# Patient Record
Sex: Female | Born: 1985 | Race: Black or African American | Hispanic: No | Marital: Single | Smoking: Former smoker
Health system: Southern US, Community
[De-identification: ages and names within clinical notes are randomized; demographics above are authoritative.]

## PROBLEM LIST (undated history)

## (undated) ENCOUNTER — Inpatient Hospital Stay (HOSPITAL_COMMUNITY): Payer: Self-pay

## (undated) DIAGNOSIS — A749 Chlamydial infection, unspecified: Secondary | ICD-10-CM

## (undated) DIAGNOSIS — M545 Low back pain, unspecified: Secondary | ICD-10-CM

## (undated) DIAGNOSIS — F32A Depression, unspecified: Secondary | ICD-10-CM

## (undated) DIAGNOSIS — B009 Herpesviral infection, unspecified: Secondary | ICD-10-CM

## (undated) DIAGNOSIS — F329 Major depressive disorder, single episode, unspecified: Secondary | ICD-10-CM

## (undated) DIAGNOSIS — F4310 Post-traumatic stress disorder, unspecified: Secondary | ICD-10-CM

## (undated) DIAGNOSIS — O24419 Gestational diabetes mellitus in pregnancy, unspecified control: Secondary | ICD-10-CM

## (undated) DIAGNOSIS — Z8632 Personal history of gestational diabetes: Secondary | ICD-10-CM

## (undated) DIAGNOSIS — F339 Major depressive disorder, recurrent, unspecified: Secondary | ICD-10-CM

## (undated) DIAGNOSIS — F419 Anxiety disorder, unspecified: Secondary | ICD-10-CM

## (undated) HISTORY — DX: Herpesviral infection, unspecified: B00.9

## (undated) HISTORY — DX: Depression, unspecified: F32.A

## (undated) HISTORY — DX: Gestational diabetes mellitus in pregnancy, unspecified control: O24.419

## (undated) HISTORY — DX: Morbid (severe) obesity due to excess calories: E66.01

## (undated) HISTORY — DX: Chlamydial infection, unspecified: A74.9

## (undated) HISTORY — DX: Low back pain, unspecified: M54.50

## (undated) HISTORY — DX: Personal history of gestational diabetes: Z86.32

## (undated) HISTORY — DX: Anxiety disorder, unspecified: F41.9

## (undated) HISTORY — PX: NO PAST SURGERIES: SHX2092

## (undated) HISTORY — DX: Post-traumatic stress disorder, unspecified: F43.10

## (undated) HISTORY — DX: Major depressive disorder, recurrent, unspecified: F33.9

---
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
## 2017-02-28 DIAGNOSIS — Z283 Underimmunization status: Secondary | ICD-10-CM | POA: Insufficient documentation

## 2017-02-28 DIAGNOSIS — Z2839 Other underimmunization status: Secondary | ICD-10-CM | POA: Insufficient documentation

## 2017-04-21 ENCOUNTER — Encounter (HOSPITAL_COMMUNITY): Payer: Self-pay | Admitting: *Deleted

## 2017-04-21 ENCOUNTER — Other Ambulatory Visit: Payer: Self-pay

## 2017-04-21 ENCOUNTER — Inpatient Hospital Stay (HOSPITAL_COMMUNITY)
Admission: AD | Admit: 2017-04-21 | Discharge: 2017-04-21 | Disposition: A | Discharge status: Home / Self Care | Payer: Medicare Other | Source: Ambulatory Visit | Attending: Family Medicine | Admitting: Family Medicine

## 2017-04-21 DIAGNOSIS — Z3A16 16 weeks gestation of pregnancy: Secondary | ICD-10-CM | POA: Diagnosis not present

## 2017-04-21 DIAGNOSIS — Z87891 Personal history of nicotine dependence: Secondary | ICD-10-CM | POA: Insufficient documentation

## 2017-04-21 DIAGNOSIS — A084 Viral intestinal infection, unspecified: Secondary | ICD-10-CM

## 2017-04-21 DIAGNOSIS — O219 Vomiting of pregnancy, unspecified: Secondary | ICD-10-CM | POA: Diagnosis present

## 2017-04-21 LAB — URINALYSIS, ROUTINE W REFLEX MICROSCOPIC
Bilirubin Urine: NEGATIVE
Glucose, UA: NEGATIVE mg/dL
Hgb urine dipstick: NEGATIVE
Ketones, ur: 80 mg/dL — AB
Nitrite: NEGATIVE
Protein, ur: 30 mg/dL — AB
Specific Gravity, Urine: 1.024 (ref 1.005–1.030)
pH: 6 (ref 5.0–8.0)

## 2017-04-21 MED ORDER — LACTATED RINGERS IV BOLUS (SEPSIS)
1000.0000 mL | Freq: Once | INTRAVENOUS | Status: AC
  Administered 2017-04-21: 1000 mL via INTRAVENOUS

## 2017-04-21 MED ORDER — FAMOTIDINE IN NACL 20-0.9 MG/50ML-% IV SOLN
20.0000 mg | Freq: Once | INTRAVENOUS | Status: AC
  Administered 2017-04-21: 20 mg via INTRAVENOUS
  Filled 2017-04-21: qty 50

## 2017-04-21 MED ORDER — ONDANSETRON HCL 4 MG/2ML IJ SOLN
4.0000 mg | Freq: Once | INTRAMUSCULAR | Status: AC
  Administered 2017-04-21: 4 mg via INTRAVENOUS
  Filled 2017-04-21: qty 2

## 2017-04-21 MED ORDER — METOCLOPRAMIDE HCL 5 MG/ML IJ SOLN
10.0000 mg | Freq: Once | INTRAMUSCULAR | Status: AC
  Administered 2017-04-21: 10 mg via INTRAVENOUS
  Filled 2017-04-21: qty 2

## 2017-04-21 NOTE — MAU Note (Signed)
Presents to mau with c/o vomiting since 5p on Monday.  C/o dizziness.  Has medication for nausea given by ob at [facility] zofran odt/phenergan and neither are helping.  Has sharp abdominal pain. Unsure is she has loose stools.

## 2017-04-21 NOTE — MAU Provider Note (Signed)
History     CSN: [id number]  Arrival date and time: 04/21/17 1247   First Provider Initiated Contact with Patient 04/21/17 1346      Chief Complaint  Patient presents with  . Emesis During Pregnancy   HPI  Ms. Suzanne Bonilla is a 32 y.o. G3P2002 at 2671w6d who presents to MAU today with complaint of N/V. The patient states that she has had N/V throughout the pregnancy, but it has been worse since last night. She denies diarrhea, fever, bleeding, abdominal pain or sick contacts. She denies complications with this pregnancy, but had GDM with previous pregnancies. She has tried Zofran and Phenergan for the nausea without relief. She is getting prenatal care at [facility] [place] [facility] [place].   OB History    Gravida Para Term Preterm AB Living   3 2 2  0 0 2   SAB TAB Ectopic Multiple Live Births   0 0 0 1 2      History reviewed. No pertinent past medical history.  History reviewed. No pertinent surgical history.  History reviewed. No pertinent family history.  Social History   Tobacco Use  . Smoking status: Former Games developermoker  . Smokeless tobacco: Former Engineer, waterUser  Substance Use Topics  . Alcohol use: No    Frequency: Never  . Drug use: No    Allergies: Not on File  No medications prior to admission.    Review of Systems  Constitutional: Negative for fever.  Gastrointestinal: Positive for nausea and vomiting. Negative for abdominal pain, constipation and diarrhea.  Genitourinary: Negative for dysuria, frequency, urgency, vaginal bleeding and vaginal discharge.   Physical Exam   Blood pressure (!) 117/49, pulse 84, temperature 98.3 F (36.8 C), temperature source Oral, resp. rate 18, height 5\' 10"  (1.778 m), weight 290 lb (131.5 kg), unknown if currently breastfeeding.  Physical Exam  Nursing note and vitals reviewed. Constitutional: She is oriented to person, place, and time. She appears well-developed and well-nourished. No distress.  HENT:  Head: Normocephalic and  atraumatic.  Cardiovascular: Normal rate, regular rhythm and normal heart sounds.  Respiratory: Effort normal and breath sounds normal. No respiratory distress. She has no wheezes.  GI: Soft. Bowel sounds are normal. She exhibits no distension and no mass. There is no tenderness. There is no rebound and no guarding.  Neurological: She is alert and oriented to person, place, and time.  Skin: Skin is warm and dry. No erythema.  Psychiatric: She has a normal mood and affect.   Results for orders placed or performed during the hospital encounter of 04/21/17 (from the past 24 hour(s))  Urinalysis, Routine w reflex microscopic     Status: Abnormal   Collection Time: 04/21/17 10:58 AM  Result Value Ref Range   Color, Urine YELLOW YELLOW   APPearance HAZY (A) CLEAR   Specific Gravity, Urine 1.024 1.005 - 1.030   pH 6.0 5.0 - 8.0   Glucose, UA NEGATIVE NEGATIVE mg/dL   Hgb urine dipstick NEGATIVE NEGATIVE   Bilirubin Urine NEGATIVE NEGATIVE   Ketones, ur 80 (A) NEGATIVE mg/dL   Protein, ur 30 (A) NEGATIVE mg/dL   Nitrite NEGATIVE NEGATIVE   Leukocytes, UA SMALL (A) NEGATIVE   RBC / HPF 0-5 0 - 5 RBC/hpf   WBC, UA 0-5 0 - 5 WBC/hpf   Bacteria, UA RARE (A) NONE SEEN   Squamous Epithelial / LPF 6-30 (A) NONE SEEN   Mucus PRESENT    Hyaline Casts, UA PRESENT     MAU Course  Procedures Pt informed that the ultrasound is considered a limited OB ultrasound and is not intended to be a complete ultrasound exam.  Patient also informed that the ultrasound is not being completed with the intent of assessing for fetal or placental anomalies or any pelvic abnormalities.  Explained that the purpose of today's ultrasound is to assess for  viability.  Patient acknowledges the purpose of the exam and the limitations of the study. +FHR noted. Appears appropriate rate. +FM.   MDM Unable to obtain FHTs with doppler, US performed at bedside as noted above.  UA today  IV LR bolus, 4 mg Zofran and 20 mg  Prilosec Patient reports improvement in symptoms. Tried PO challenge and had some nausea without vomiting. 10 mg IV Reglan given. No further emesis while in MAU. Patient able to tolerate PO.   Assessment and Plan  A: SIUP at [redacted]w[redacted]d Nausea and vomiting in pregnancy prior to 22 weeks likely viral gastroenteritis   P: Discharge home Continue anti-emetics as previously prescribed Second trimester precautions discussed Increase PO hydration and advance diet as tolerated Patient advised to follow-up with [facility] as scheduled for routine prenatal care or sooner PRN Patient may return to MAU as needed or if her condition were to change or worsen   Vonzella Nipple, PA-C 04/21/2017, 4:41 PM

## 2017-04-21 NOTE — MAU Note (Signed)
Pt. States, she feels a little better.  PO challenge initiated. Crackers & ginger ale given.

## 2017-04-21 NOTE — Discharge Instructions (Signed)
Viral Gastroenteritis, Adult  Viral gastroenteritis is also known as the stomach flu. This condition is caused by certain germs (viruses). These germs can be passed from person to person very easily (are very contagious). This condition can cause sudden watery poop (diarrhea), fever, and throwing up (vomiting).  Having watery poop and throwing up can make you feel weak and cause you to get dehydrated. Dehydration can make you tired and thirsty, make you have a dry mouth, and make it so you pee (urinate) less often. Older adults and people with other diseases or a weak defense system (immune system) are at higher risk for dehydration. It is important to replace the fluids that you lose from having watery poop and throwing up.  Follow these instructions at home:  Follow instructions from your doctor about how to care for yourself at home.  Eating and drinking    Follow these instructions as told by your doctor:   Take an oral rehydration solution (ORS). This is a drink that is sold at pharmacies and stores.   Drink clear fluids in small amounts as you are able, such as:  ? Water.  ? Ice chips.  ? Diluted fruit juice.  ? Low-calorie sports drinks.   Eat bland, easy-to-digest foods in small amounts as you are able, such as:  ? Bananas.  ? Applesauce.  ? Rice.  ? Low-fat (lean) meats.  ? Toast.  ? Crackers.   Avoid fluids that have a lot of sugar or caffeine in them.   Avoid alcohol.   Avoid spicy or fatty foods.    General instructions   Drink enough fluid to keep your pee (urine) clear or pale yellow.   Wash your hands often. If you cannot use soap and water, use hand sanitizer.   Make sure that all people in your home wash their hands well and often.   Rest at home while you get better.   Take over-the-counter and prescription medicines only as told by your doctor.   Watch your condition for any changes.   Take a warm bath to help with any burning or pain from having watery poop.   Keep all follow-up  visits as told by your doctor. This is important.  Contact a doctor if:   You cannot keep fluids down.   Your symptoms get worse.   You have new symptoms.   You feel light-headed or dizzy.   You have muscle cramps.  Get help right away if:   You have chest pain.   You feel very weak or you pass out (faint).   You see blood in your throw-up.   Your throw-up looks like coffee grounds.   You have bloody or black poop (stools) or poop that look like tar.   You have a very bad headache, a stiff neck, or both.   You have a rash.   You have very bad pain, cramping, or bloating in your belly (abdomen).   You have trouble breathing.   You are breathing very quickly.   Your heart is beating very quickly.   Your skin feels cold and clammy.   You feel confused.   You have pain when you pee.   You have signs of dehydration, such as:  ? Dark pee, hardly any pee, or no pee.  ? Cracked lips.  ? Dry mouth.  ? Sunken eyes.  ? Sleepiness.  ? Weakness.  This information is not intended to replace advice given to you by your   health care provider. Make sure you discuss any questions you have with your health care provider.  Document Released: 09/24/2007 Document Revised: 10/26/2015 Document Reviewed: 12/12/2014  Elsevier Interactive Patient Education  2017 Elsevier Inc.        Food Choices to Help Relieve Diarrhea, Adult  When you have diarrhea, the foods you eat and your eating habits are very important. Choosing the right foods and drinks can help:   Relieve diarrhea.   Replace lost fluids and nutrients.   Prevent dehydration.    What general guidelines should I follow?  Relieving diarrhea   Choose foods with less than 2 g or .07 oz. of fiber per serving.   Limit fats to less than 8 tsp (38 g or 1.34 oz.) a day.   Avoid the following:  ? Foods and beverages sweetened with high-fructose corn syrup, honey, or sugar alcohols such as xylitol, sorbitol, and mannitol.  ? Foods that contain a lot of fat or  sugar.  ? Fried, greasy, or spicy foods.  ? High-fiber grains, breads, and cereals.  ? Raw fruits and vegetables.   Eat foods that are rich in probiotics. These foods include dairy products such as yogurt and fermented milk products. They help increase healthy bacteria in the stomach and intestines (gastrointestinal tract, or GI tract).   If you have lactose intolerance, avoid dairy products. These may make your diarrhea worse.   Take medicine to help stop diarrhea (antidiarrheal medicine) only as told by your health care provider.  Replacing nutrients   Eat small meals or snacks every 3-4 hours.   Eat bland foods, such as white rice, toast, or baked potato, until your diarrhea starts to get better. Gradually reintroduce nutrient-rich foods as tolerated or as told by your health care provider. This includes:  ? Well-cooked protein foods.  ? Peeled, seeded, and soft-cooked fruits and vegetables.  ? Low-fat dairy products.   Take vitamin and mineral supplements as told by your health care provider.  Preventing dehydration     Start by sipping water or a special solution to prevent dehydration (oral rehydration solution, ORS). Urine that is clear or pale yellow means that you are getting enough fluid.   Try to drink at least 8-10 cups of fluid each day to help replace lost fluids.   You may add other liquids in addition to water, such as clear juice or decaffeinated sports drinks, as tolerated or as told by your health care provider.   Avoid drinks with caffeine, such as coffee, tea, or soft drinks.   Avoid alcohol.  What foods are recommended?  The items listed may not be a complete list. Talk with your health care provider about what dietary choices are best for you.  Grains  White rice. White, French, or pita breads (fresh or toasted), including plain rolls, buns, or bagels. White pasta. Saltine, soda, or graham crackers. Pretzels. Low-fiber cereal. Cooked cereals made with water (such as cornmeal,  farina, or cream cereals). Plain muffins. Matzo. Melba toast. Zwieback.  Vegetables  Potatoes (without the skin). Most well-cooked and canned vegetables without skins or seeds. Tender lettuce.  Fruits  Apple sauce. Fruits canned in juice. Cooked apricots, cherries, grapefruit, peaches, pears, or plums. Fresh bananas and cantaloupe.  Meats and other protein foods  Baked or boiled chicken. Eggs. Tofu. Fish. Seafood. Smooth nut butters. Ground or well-cooked tender beef, ham, veal, lamb, pork, or poultry.  Dairy  Plain yogurt, kefir, and unsweetened liquid yogurt. Lactose-free milk, buttermilk, skim   milk, or soy milk. Low-fat or nonfat hard cheese.  Beverages  Water. Low-calorie sports drinks. Fruit juices without pulp. Strained tomato and vegetable juices. Decaffeinated teas. Sugar-free beverages not sweetened with sugar alcohols. Oral rehydration solutions, if approved by your health care provider.  Seasoning and other foods  Bouillon, broth, or soups made from recommended foods.  What foods are not recommended?  The items listed may not be a complete list. Talk with your health care provider about what dietary choices are best for you.  Grains  Whole grain, whole wheat, bran, or rye breads, rolls, pastas, and crackers. Wild or brown rice. Whole grain or bran cereals. Barley. Oats and oatmeal. Corn tortillas or taco shells. Granola. Popcorn.  Vegetables  Raw vegetables. Fried vegetables. Cabbage, broccoli, Brussels sprouts, artichokes, baked beans, beet greens, corn, kale, legumes, peas, sweet potatoes, and yams. Potato skins. Cooked spinach and cabbage.  Fruits  Dried fruit, including raisins and dates. Raw fruits. Stewed or dried prunes. Canned fruits with syrup.  Meat and other protein foods  Fried or fatty meats. Deli meats. Chunky nut butters. Nuts and seeds. Beans and lentils. Bacon. Hot dogs. Sausage.  Dairy  High-fat cheeses. Whole milk, chocolate milk, and beverages made with milk, such as milk shakes.  Half-and-half. Cream. sour cream. Ice cream.  Beverages  Caffeinated beverages (such as coffee, tea, soda, or energy drinks). Alcoholic beverages. Fruit juices with pulp. Prune juice. Soft drinks sweetened with high-fructose corn syrup or sugar alcohols. High-calorie sports drinks.  Fats and oils  Butter. Cream sauces. Margarine. Salad oils. Plain salad dressings. Olives. Avocados. Mayonnaise.  Sweets and desserts  Sweet rolls, doughnuts, and sweet breads. Sugar-free desserts sweetened with sugar alcohols such as xylitol and sorbitol.  Seasoning and other foods  Honey. Hot sauce. Chili powder. Gravy. Cream-based or milk-based soups. Pancakes and waffles.  Summary   When you have diarrhea, the foods you eat and your eating habits are very important.   Make sure you get at least 8-10 cups of fluid each day, or enough to keep your urine clear or pale yellow.   Eat bland foods and gradually reintroduce healthy, nutrient-rich foods as tolerated, or as told by your health care provider.   Avoid high-fiber, fried, greasy, or spicy foods.  This information is not intended to replace advice given to you by your health care provider. Make sure you discuss any questions you have with your health care provider.  Document Released: 06/28/2003 Document Revised: 04/04/2016 Document Reviewed: 04/04/2016  Elsevier Interactive Patient Education  2018 Elsevier Inc.

## 2017-04-21 NOTE — MAU Note (Signed)
Pt. Feeling nauseous after eating crackers and drinking ginger ale.  Pt. Up to BR for void. Stable gait. Provider notified - new order received.

## 2017-04-21 NOTE — MAU Note (Signed)
[facility] to locate fetal heart rate.  Cardiac activity noted. POCT discontinued.

## 2017-12-23 ENCOUNTER — Encounter (HOSPITAL_COMMUNITY): Payer: Self-pay

## 2018-10-11 LAB — CYTOLOGY - PAP: Pap: NEGATIVE

## 2018-10-28 LAB — OB RESULTS CONSOLE ABO/RH: RH Type: POSITIVE

## 2018-10-28 LAB — URINE CULTURE: Urine Culture, OB: NEGATIVE

## 2018-10-28 LAB — OB RESULTS CONSOLE HGB/HCT, BLOOD
HCT: 34 (ref 29–41)
Hemoglobin: 10.9

## 2018-10-28 LAB — OB RESULTS CONSOLE HIV ANTIBODY (ROUTINE TESTING): HIV: NONREACTIVE

## 2018-10-28 LAB — OB RESULTS CONSOLE RPR: RPR: NONREACTIVE

## 2018-10-28 LAB — OB RESULTS CONSOLE GC/CHLAMYDIA
Chlamydia: NEGATIVE
Gonorrhea: NEGATIVE

## 2018-10-28 LAB — DRUG SCREEN, URINE
Glucose 1 Hour: 224
Hemoglobin A1C: 6.5

## 2018-10-28 LAB — OB RESULTS CONSOLE HEPATITIS B SURFACE ANTIGEN: Hepatitis B Surface Ag: NEGATIVE

## 2018-10-28 LAB — OB RESULTS CONSOLE RUBELLA ANTIBODY, IGM: Rubella: UNDETERMINED

## 2018-10-28 LAB — OB RESULTS CONSOLE ANTIBODY SCREEN: Antibody Screen: NEGATIVE

## 2018-10-28 LAB — OB RESULTS CONSOLE PLATELET COUNT: Platelets: 313

## 2018-10-29 DIAGNOSIS — O24419 Gestational diabetes mellitus in pregnancy, unspecified control: Secondary | ICD-10-CM | POA: Insufficient documentation

## 2018-11-26 LAB — AFP, SERUM, OPEN SPINA BIFIDA: Open Spina bifida: NEGATIVE

## 2019-03-09 ENCOUNTER — Telehealth: Payer: Self-pay | Admitting: *Deleted

## 2019-03-09 NOTE — Telephone Encounter (Signed)
    Lorriane Shire from the [employer] [facility] [employer] [facility] shared with me the need for Diabetes Education for this patient ASAP. She has an MD appointment on Dec 3rd and I have no openings before then. Patient has 3 children and no childcare available.   I spoke to Gillian Scarce, my Manager, to see if we could see her there next Monday since we have time blocked for GDM patients while I am on PAL. She approved for the patient to come to [facility] with her children if necessary.   I called the patient to let her know of that option. She stated she has a history of GDM with 2 of her last 3 pregnancies. She is testing her BG currently and states she is writing her numbers down. She feels her understanding of Carb Counting is adequate. She states she was started on insulin earlier in her pregnancy by another office in [facility], but ran out and has not taken it for 3 months so her blood sugars are too high.   So her immediate need appears to be seeing the MD for her insulin Rx. She declines need for Diabetes Education prior to that appointment. I feel the Diabetes Education appointment will be more effective after she sees the MD and gets her insulin Rx again.

## 2019-03-10 ENCOUNTER — Encounter: Payer: Self-pay | Admitting: *Deleted

## 2019-03-10 ENCOUNTER — Ambulatory Visit (INDEPENDENT_AMBULATORY_CARE_PROVIDER_SITE_OTHER): Payer: Medicare Other | Admitting: *Deleted

## 2019-03-10 ENCOUNTER — Other Ambulatory Visit: Payer: Self-pay

## 2019-03-10 DIAGNOSIS — Z8632 Personal history of gestational diabetes: Secondary | ICD-10-CM

## 2019-03-10 DIAGNOSIS — F4310 Post-traumatic stress disorder, unspecified: Secondary | ICD-10-CM | POA: Insufficient documentation

## 2019-03-10 DIAGNOSIS — Z9189 Other specified personal risk factors, not elsewhere classified: Secondary | ICD-10-CM | POA: Insufficient documentation

## 2019-03-10 DIAGNOSIS — IMO0001 Reserved for inherently not codable concepts without codable children: Secondary | ICD-10-CM | POA: Insufficient documentation

## 2019-03-10 DIAGNOSIS — O0990 Supervision of high risk pregnancy, unspecified, unspecified trimester: Secondary | ICD-10-CM

## 2019-03-10 DIAGNOSIS — F419 Anxiety disorder, unspecified: Secondary | ICD-10-CM

## 2019-03-10 DIAGNOSIS — M545 Low back pain, unspecified: Secondary | ICD-10-CM | POA: Insufficient documentation

## 2019-03-10 DIAGNOSIS — Z91410 Personal history of adult physical and sexual abuse: Secondary | ICD-10-CM | POA: Insufficient documentation

## 2019-03-10 DIAGNOSIS — M5440 Lumbago with sciatica, unspecified side: Secondary | ICD-10-CM

## 2019-03-10 DIAGNOSIS — F339 Major depressive disorder, recurrent, unspecified: Secondary | ICD-10-CM

## 2019-03-10 DIAGNOSIS — Z8619 Personal history of other infectious and parasitic diseases: Secondary | ICD-10-CM

## 2019-03-10 DIAGNOSIS — O24419 Gestational diabetes mellitus in pregnancy, unspecified control: Secondary | ICD-10-CM

## 2019-03-10 MED ORDER — BLOOD PRESSURE KIT DEVI: 1.0000 | 0 refills | Status: AC | PRN

## 2019-03-10 NOTE — Addendum Note (Signed)
Addended by: Samuel Germany on: 03/10/2019 01:25 PM   Modules accepted: Orders

## 2019-03-10 NOTE — Progress Notes (Signed)
I connected with  Suzanne Bonilla on 03/10/19 at  8:30 AM EST by telephone and verified that I am speaking with the correct person using two identifiers.   I discussed the limitations, risks, security and privacy concerns of performing an evaluation and management service by telephone and the availability of in person appointments. I also discussed with the patient that there may be a patient responsible charge related to this service. The patient expressed understanding and agreed to proceed. Explained I am completing her New OB Intake today. She is transferring care from [place]- we have her records. We discussed Her EDD and that it is based on  [place] . I reviewed her allergies, meds, OB History, Medical /Surgical history, and appropriate screenings. I explained I will send her the [employer] app- app sent to her while on phone.  I explained we will send a blood pressure cuff to [facility] that will fill that prescription and they  will call her to verify her information. I asked her to bring the blood pressure cuff with her to her first ob appointment so we can show her how to use it. Explained  then we will have her take her blood pressure weekly and enter into the app. Explained she will have some visits in office and some virtually. She has [facility]Chart with [facility] ; I gave her MyChart help desk to get [place] My Chart linked. I Reviewed appointment date/ time with her , our location and to wear mask, no visitors. Explained she will have exam, ob bloodwork, hemoglobin a1C, cbg , genetic testing if desired, pap if needed. I scheduled an [place] .  She voices understanding.   Linda,RN 03/10/2019  8:56 AM

## 2019-03-10 NOTE — Progress Notes (Signed)
Patient seen and assessed by nursing staff.  Agree with documentation and plan.  

## 2019-03-10 NOTE — Patient Instructions (Signed)

## 2019-03-10 NOTE — Addendum Note (Signed)
Addended by: Geralyn Flash L on: 03/10/2019 02:00 PM   Modules accepted: Orders

## 2019-03-10 NOTE — Progress Notes (Signed)
Addendum:  03/10/19 I made an [place] appointment for first available since per records no anatomy US done and patient has GDM.  Made [place] for first available for 03/25/19 at 0830. I called and notified Kristianna of this appointment. Tamisha Nordstrom,RN

## 2019-03-14 ENCOUNTER — Ambulatory Visit (INDEPENDENT_AMBULATORY_CARE_PROVIDER_SITE_OTHER): Payer: Medicare Other | Admitting: Family Medicine

## 2019-03-14 ENCOUNTER — Other Ambulatory Visit: Payer: Self-pay

## 2019-03-14 ENCOUNTER — Ambulatory Visit: Payer: Self-pay | Admitting: Clinical

## 2019-03-14 ENCOUNTER — Encounter: Payer: Self-pay | Admitting: Family Medicine

## 2019-03-14 VITALS — BP 120/81 | HR 109 | Wt 337.0 lb

## 2019-03-14 DIAGNOSIS — Z2839 Other underimmunization status: Secondary | ICD-10-CM

## 2019-03-14 DIAGNOSIS — O0993 Supervision of high risk pregnancy, unspecified, third trimester: Secondary | ICD-10-CM

## 2019-03-14 DIAGNOSIS — O99213 Obesity complicating pregnancy, third trimester: Secondary | ICD-10-CM

## 2019-03-14 DIAGNOSIS — Z658 Other specified problems related to psychosocial circumstances: Secondary | ICD-10-CM

## 2019-03-14 DIAGNOSIS — O24414 Gestational diabetes mellitus in pregnancy, insulin controlled: Secondary | ICD-10-CM

## 2019-03-14 DIAGNOSIS — O99343 Other mental disorders complicating pregnancy, third trimester: Secondary | ICD-10-CM

## 2019-03-14 DIAGNOSIS — Z3A33 33 weeks gestation of pregnancy: Secondary | ICD-10-CM

## 2019-03-14 DIAGNOSIS — Z8619 Personal history of other infectious and parasitic diseases: Secondary | ICD-10-CM

## 2019-03-14 DIAGNOSIS — Z283 Underimmunization status: Secondary | ICD-10-CM

## 2019-03-14 DIAGNOSIS — F418 Other specified anxiety disorders: Secondary | ICD-10-CM

## 2019-03-14 DIAGNOSIS — Z23 Encounter for immunization: Secondary | ICD-10-CM | POA: Diagnosis not present

## 2019-03-14 DIAGNOSIS — O99891 Other specified diseases and conditions complicating pregnancy: Secondary | ICD-10-CM

## 2019-03-14 DIAGNOSIS — Z114 Encounter for screening for human immunodeficiency virus [HIV]: Secondary | ICD-10-CM

## 2019-03-14 DIAGNOSIS — O0990 Supervision of high risk pregnancy, unspecified, unspecified trimester: Secondary | ICD-10-CM

## 2019-03-14 MED ORDER — ACETAMINOPHEN 500 MG PO TABS: 500.0000 mg | ORAL_TABLET | ORAL | 2 refills | Status: DC | PRN

## 2019-03-14 MED ORDER — BUPROPION HCL ER (XL) 150 MG PO TB24: 150.0000 mg | ORAL_TABLET | Freq: Every day | ORAL | 3 refills | Status: AC

## 2019-03-14 MED ORDER — VALACYCLOVIR HCL 500 MG PO TABS: 500.0000 mg | ORAL_TABLET | Freq: Two times a day (BID) | ORAL | 2 refills | Status: AC

## 2019-03-14 MED ORDER — LEVEMIR FLEXTOUCH 100 UNIT/ML ~~LOC~~ SOPN: 46.0000 [IU] | PEN_INJECTOR | Freq: Every day | SUBCUTANEOUS | 3 refills | Status: DC

## 2019-03-14 MED ORDER — ALBUTEROL SULFATE HFA 108 (90 BASE) MCG/ACT IN AERS: 2.0000 | INHALATION_SPRAY | RESPIRATORY_TRACT | 1 refills | Status: AC | PRN

## 2019-03-14 MED ORDER — HUMALOG JUNIOR KWIKPEN 100 UNIT/ML ~~LOC~~ SOPN: 15.0000 [IU] | PEN_INJECTOR | Freq: Three times a day (TID) | SUBCUTANEOUS | 3 refills | Status: DC

## 2019-03-14 MED ORDER — BD PEN NEEDLE MINI U/F 31G X 5 MM MISC: 3 refills | Status: DC

## 2019-03-14 MED ORDER — SERTRALINE HCL 100 MG PO TABS: 100.0000 mg | ORAL_TABLET | Freq: Every day | ORAL | 3 refills | Status: AC

## 2019-03-14 NOTE — Patient Instructions (Signed)
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
## 2019-03-14 NOTE — Progress Notes (Signed)
GDM on insulin checking Blood sugars out of strips Wellbutrin Flexeril Zoloft Valtrex Prenatals Insulin

## 2019-03-14 NOTE — BH Specialist Note (Signed)
Integrated Behavioral Health Initial Visit  MRN: [id number] Name: Suzanne Bonilla  Number of [place] Clinician visits:: 1/6 Session Start time: 11:55  Session End time: 12:05 Total time: 15  Type of Service: [place] Interpretor:No. Interpretor Name and Language: n/a   Warm Hand Off Completed.       SUBJECTIVE: Suzanne Bonilla is a 33 y.o. female accompanied by children (17yo and 36yo daughters; 1yo son) Patient was referred by Darron Doom, MD for psychosocial. Patient reports the following symptoms/concerns: Pt states her primary concern today is life stress, specifically lack of child care; pt attributes lack of medical care during pregnancy to no childcare available for appointments. Pt says one child's paternal aunt will keep children when she goes into labor; [facility]  Duration of problem: Current pregnancy; Severity of problem: moderate  OBJECTIVE: Mood: Anxious and Affect: Appropriate Risk of harm to self or others: No plan to harm self or others  LIFE CONTEXT: Family and Social: Pt lives with her three children (10yo,4yo;1yo) School/Work: - Self-Care: - Life Changes: Current pregnancy  GOALS ADDRESSED: Patient will: 1. Reduce symptoms of: stress 2. Increase knowledge and/or ability of: stress reduction  3. Demonstrate ability to: Increase adequate support systems for patient/family  INTERVENTIONS: Interventions utilized: Supportive Counseling and Link to [employer]  Standardized Assessments completed: not given today  ASSESSMENT: Patient currently experiencing Psychosocial stress.   Patient may benefit from link to community resource today.  PLAN: 1. Follow up with behavioral health clinician on : One week 2. Behavioral recommendations:  -Sign [id number] consent; accept childcare referral today (aware there is no guarantee)  3. Referral(s): Integrated  [facility] (In Clinic) and [employer]:  childcare 4. "From scale of 1-10, how likely are you to follow plan?": -  [name], LCSW  Depression screen [facility] 2/9 03/10/2019  Decreased Interest 3  Down, Depressed, Hopeless 2  PHQ - 2 Score 5  Altered sleeping 3  Tired, decreased energy 3  Change in appetite 0  Feeling bad or failure about yourself  3  Trouble concentrating 0  Moving slowly or fidgety/restless 0  Suicidal thoughts 0  PHQ-9 Score 14   GAD 7 : Generalized Anxiety Score 03/10/2019  Nervous, Anxious, on Edge 0  Control/stop worrying 3  Worry too much - different things 3  Trouble relaxing 3  Restless 0  Easily annoyed or irritable 3  Afraid - awful might happen 1  Total GAD 7 Score 13

## 2019-03-15 LAB — CBC
Hematocrit: 36.4 % (ref 34.0–46.6)
Hemoglobin: 11 g/dL — ABNORMAL LOW (ref 11.1–15.9)
MCH: 21 pg — ABNORMAL LOW (ref 26.6–33.0)
MCHC: 30.2 g/dL — ABNORMAL LOW (ref 31.5–35.7)
MCV: 69 fL — ABNORMAL LOW (ref 79–97)
Platelets: 333 10*3/uL (ref 150–450)
RBC: 5.25 x10E6/uL (ref 3.77–5.28)
RDW: 16.6 % — ABNORMAL HIGH (ref 11.7–15.4)
WBC: 10 10*3/uL (ref 3.4–10.8)

## 2019-03-15 LAB — HIV ANTIBODY (ROUTINE TESTING W REFLEX): HIV Screen 4th Generation wRfx: NONREACTIVE

## 2019-03-15 LAB — GLUCOSE, CAPILLARY: Glucose-Capillary: 137 mg/dL — ABNORMAL HIGH (ref 70–99)

## 2019-03-15 LAB — RPR: RPR Ser Ql: NONREACTIVE

## 2019-03-16 NOTE — BH Specialist Note (Signed)
Integrated Behavioral Health via Telemedicine Video Visit  03/16/2019 Suzanne Bonilla [id number]  Number of Integrated Behavioral Health visits: 2 Session Start time: 9:18  Session End time: 9:56 Total time: 77  Referring Provider: Tinnie Gens, MD Type of Visit: Video Patient/Family location: Home [facility] Provider location: [facility] All persons participating in visit: Patient Suzanne Bonilla and [facility]      Confirmed patient's address: Yes  Confirmed patient's phone number: Yes  Any changes to demographics: No   Confirmed patient's insurance: Yes  Any changes to patient's insurance: No   Discussed confidentiality: At previous visit  I connected with Suzanne Bonilla  by a video enabled telemedicine application and verified that I am speaking with the correct person using two identifiers.     I discussed the limitations of evaluation and management by telemedicine and the availability of in person appointments.  I discussed that the purpose of this visit is to provide behavioral health care while limiting exposure to the novel coronavirus.   Discussed there is a possibility of technology failure and discussed alternative modes of communication if that failure occurs.  I discussed that engaging in this video visit, they consent to the provision of behavioral healthcare and the services will be billed under their insurance.  Patient and/or legal guardian expressed understanding and consented to video visit: Yes   PRESENTING CONCERNS: Patient and/or family reports the following symptoms/concerns: Pt states her primary concern is stress over lack of reliable childcare. Pt has minimal to no support from children's fathers; goals are to have a healthy pregnancy short-term, and to provide well for her children, by buying her own house and car. Pt attributes symptoms of depression and anxiety to current life stress, and feels it is temporary.  Duration of problem: Current pregnancy;  Severity of problem: moderate  STRENGTHS (Protective Factors/Coping Skills): Some social support from family, strong resilience; learning to set boundaries  GOALS ADDRESSED: Patient will: 1.  Reduce symptoms of: anxiety, depression and stress  2.  Increase knowledge and/or ability of: stress reduction  3.  Demonstrate ability to: Increase healthy adjustment to current life circumstances  INTERVENTIONS: Interventions utilized:  Motivational Interviewing Standardized Assessments completed: Not Needed  ASSESSMENT: Patient currently experiencing Adjustment disorder with anxious and depressed mood and Psychosocial stress  Patient may benefit from continued brief therapeutic intervention regarding coping with symptoms of anxiety, depression and life stress.  PLAN: 1. Follow up with behavioral health clinician on : Two weeks 2. Behavioral recommendations:  -Continue to set healthy boundaries in unhealthy relationships, with a focus on self-care -Continue taking prenatal vitamins daily -Continue with discussions w family regarding need for childcare while in labor and medical visits 3. Referral(s): Integrated [employer] (In Clinic)  I discussed the assessment and treatment plan with the patient and/or parent/guardian. They were provided an opportunity to ask questions and all were answered. They agreed with the plan and demonstrated an understanding of the instructions.   They were advised to call back or seek an in-person evaluation if the symptoms worsen or if the condition fails to improve as anticipated.  Suzanne Bonilla   Depression screen [facility] 2/9 03/10/2019  Decreased Interest 3  Down, Depressed, Hopeless 2  PHQ - 2 Score 5  Altered sleeping 3  Tired, decreased energy 3  Change in appetite 0  Feeling bad or failure about yourself  3  Trouble concentrating 0  Moving slowly or fidgety/restless 0  Suicidal thoughts 0  PHQ-9 Score 14   GAD 7 :  Generalized  Anxiety Score 03/10/2019  Nervous, Anxious, on Edge 0  Control/stop worrying 3  Worry too much - different things 3  Trouble relaxing 3  Restless 0  Easily annoyed or irritable 3  Afraid - awful might happen 1  Total GAD 7 Score 13

## 2019-03-21 ENCOUNTER — Other Ambulatory Visit: Payer: Self-pay

## 2019-03-21 ENCOUNTER — Telehealth: Payer: Self-pay | Admitting: Advanced Practice Midwife

## 2019-03-21 DIAGNOSIS — O0990 Supervision of high risk pregnancy, unspecified, unspecified trimester: Secondary | ICD-10-CM

## 2019-03-21 MED ORDER — ACCU-CHEK GUIDE W/DEVICE KIT: 1.0000 | PACK | Freq: Every day | 0 refills | Status: AC

## 2019-03-21 MED ORDER — PRENATAL 27-1 MG PO TABS: 1.0000 | ORAL_TABLET | Freq: Every day | ORAL | 2 refills | Status: AC

## 2019-03-21 NOTE — Telephone Encounter (Signed)
1 

## 2019-03-22 ENCOUNTER — Other Ambulatory Visit: Payer: Self-pay

## 2019-03-22 ENCOUNTER — Ambulatory Visit (INDEPENDENT_AMBULATORY_CARE_PROVIDER_SITE_OTHER): Payer: Medicare Other | Admitting: Clinical

## 2019-03-22 DIAGNOSIS — F4323 Adjustment disorder with mixed anxiety and depressed mood: Secondary | ICD-10-CM | POA: Diagnosis not present

## 2019-03-22 DIAGNOSIS — Z658 Other specified problems related to psychosocial circumstances: Secondary | ICD-10-CM | POA: Diagnosis not present

## 2019-03-23 ENCOUNTER — Telehealth: Payer: Self-pay | Admitting: Obstetrics and Gynecology

## 2019-03-23 NOTE — Telephone Encounter (Signed)
Called the patient to complete the covid19 screening. Received the message the voicemail box is full and cannot receive messages at this time. °

## 2019-03-24 ENCOUNTER — Ambulatory Visit (INDEPENDENT_AMBULATORY_CARE_PROVIDER_SITE_OTHER): Payer: Medicare Other | Admitting: *Deleted

## 2019-03-24 ENCOUNTER — Other Ambulatory Visit: Payer: Self-pay | Admitting: Family Medicine

## 2019-03-24 ENCOUNTER — Encounter: Payer: Medicare Other | Admitting: Obstetrics & Gynecology

## 2019-03-24 ENCOUNTER — Encounter: Payer: Self-pay | Admitting: Obstetrics and Gynecology

## 2019-03-24 ENCOUNTER — Other Ambulatory Visit: Payer: Self-pay

## 2019-03-24 ENCOUNTER — Ambulatory Visit (INDEPENDENT_AMBULATORY_CARE_PROVIDER_SITE_OTHER): Payer: Medicare Other | Admitting: Obstetrics and Gynecology

## 2019-03-24 VITALS — BP 115/69 | HR 102 | Wt 341.2 lb

## 2019-03-24 DIAGNOSIS — Z2839 Other underimmunization status: Secondary | ICD-10-CM

## 2019-03-24 DIAGNOSIS — O24419 Gestational diabetes mellitus in pregnancy, unspecified control: Secondary | ICD-10-CM

## 2019-03-24 DIAGNOSIS — Z283 Underimmunization status: Secondary | ICD-10-CM

## 2019-03-24 DIAGNOSIS — F339 Major depressive disorder, recurrent, unspecified: Secondary | ICD-10-CM

## 2019-03-24 DIAGNOSIS — O0993 Supervision of high risk pregnancy, unspecified, third trimester: Secondary | ICD-10-CM

## 2019-03-24 DIAGNOSIS — O99343 Other mental disorders complicating pregnancy, third trimester: Secondary | ICD-10-CM

## 2019-03-24 DIAGNOSIS — Z8619 Personal history of other infectious and parasitic diseases: Secondary | ICD-10-CM

## 2019-03-24 DIAGNOSIS — O0990 Supervision of high risk pregnancy, unspecified, unspecified trimester: Secondary | ICD-10-CM

## 2019-03-24 DIAGNOSIS — Z3A34 34 weeks gestation of pregnancy: Secondary | ICD-10-CM

## 2019-03-24 DIAGNOSIS — O24414 Gestational diabetes mellitus in pregnancy, insulin controlled: Secondary | ICD-10-CM

## 2019-03-24 MED ORDER — NOVOLOG FLEXPEN 100 UNIT/ML ~~LOC~~ SOPN: 15.0000 [IU] | PEN_INJECTOR | Freq: Three times a day (TID) | SUBCUTANEOUS | 11 refills | Status: DC

## 2019-03-24 MED ORDER — FIASP FLEXTOUCH 100 UNIT/ML ~~LOC~~ SOPN: 15.0000 [IU] | PEN_INJECTOR | Freq: Three times a day (TID) | SUBCUTANEOUS | 3 refills | Status: DC
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
## 2019-03-24 NOTE — Patient Instructions (Signed)
Third Trimester of Pregnancy The third trimester is from week 28 through week 40 (months 7 through 9). The third trimester is a time when the unborn baby (fetus) is growing rapidly. At the end of the ninth month, the fetus is about 20 inches in length and weighs 6-10 pounds. Body changes during your third trimester Your body will continue to go through many changes during pregnancy. The changes vary from woman to woman. During the third trimester:  Your weight will continue to increase. You can expect to gain 25-35 pounds (11-16 kg) by the end of the pregnancy.  You may begin to get stretch marks on your hips, abdomen, and breasts.  You may urinate more often because the fetus is moving lower into your pelvis and pressing on your bladder.  You may develop or continue to have heartburn. This is caused by increased hormones that slow down muscles in the digestive tract.  You may develop or continue to have constipation because increased hormones slow digestion and cause the muscles that push waste through your intestines to relax.  You may develop hemorrhoids. These are swollen veins (varicose veins) in the rectum that can itch or be painful.  You may develop swollen, bulging veins (varicose veins) in your legs.  You may have increased body aches in the pelvis, back, or thighs. This is due to weight gain and increased hormones that are relaxing your joints.  You may have changes in your hair. These can include thickening of your hair, rapid growth, and changes in texture. Some women also have hair loss during or after pregnancy, or hair that feels dry or thin. Your hair will most likely return to normal after your baby is born.  Your breasts will continue to grow and they will continue to become tender. A yellow fluid (colostrum) may leak from your breasts. This is the first milk you are producing for your baby.  Your belly button may stick out.  You may notice more swelling in your hands,  face, or ankles.  You may have increased tingling or numbness in your hands, arms, and legs. The skin on your belly may also feel numb.  You may feel short of breath because of your expanding uterus.  You may have more problems sleeping. This can be caused by the size of your belly, increased need to urinate, and an increase in your body's metabolism.  You may notice the fetus "dropping," or moving lower in your abdomen (lightening).  You may have increased vaginal discharge.  You may notice your joints feel loose and you may have pain around your pelvic bone. What to expect at prenatal visits You will have prenatal exams every 2 weeks until week 36. Then you will have weekly prenatal exams. During a routine prenatal visit:  You will be weighed to make sure you and the baby are growing normally.  Your blood pressure will be taken.  Your abdomen will be measured to track your baby's growth.  The fetal heartbeat will be listened to.  Any test results from the previous visit will be discussed.  You may have a cervical check near your due date to see if your cervix has softened or thinned (effaced).  You will be tested for Group B streptococcus. This happens between 35 and 37 weeks. Your health care provider may ask you:  What your birth plan is.  How you are feeling.  If you are feeling the baby move.  If you have had any abnormal   symptoms, such as leaking fluid, bleeding, severe headaches, or abdominal cramping.  If you are using any tobacco products, including cigarettes, chewing tobacco, and electronic cigarettes.  If you have any questions. Other tests or screenings that may be performed during your third trimester include:  Blood tests that check for low iron levels (anemia).  Fetal testing to check the health, activity level, and growth of the fetus. Testing is done if you have certain medical conditions or if there are problems during the pregnancy.  Nonstress test  (NST). This test checks the health of your baby to make sure there are no signs of problems, such as the baby not getting enough oxygen. During this test, a belt is placed around your belly. The baby is made to move, and its heart rate is monitored during movement. What is false labor? False labor is a condition in which you feel small, irregular tightenings of the muscles in the womb (contractions) that usually go away with rest, changing position, or drinking water. These are called Braxton Hicks contractions. Contractions may last for hours, days, or even weeks before true labor sets in. If contractions come at regular intervals, become more frequent, increase in intensity, or become painful, you should see your health care provider. What are the signs of labor?  Abdominal cramps.  Regular contractions that start at 10 minutes apart and become stronger and more frequent with time.  Contractions that start on the top of the uterus and spread down to the lower abdomen and back.  Increased pelvic pressure and dull back pain.  A watery or bloody mucus discharge that comes from the vagina.  Leaking of amniotic fluid. This is also known as your "water breaking." It could be a slow trickle or a gush. Let your health care provider know if it has a color or strange odor. If you have any of these signs, call your health care provider right away, even if it is before your due date. Follow these instructions at home: Medicines  Follow your health care provider's instructions regarding medicine use. Specific medicines may be either safe or unsafe to take during pregnancy.  Take a prenatal vitamin that contains at least 600 micrograms (mcg) of folic acid.  If you develop constipation, try taking a stool softener if your health care provider approves. Eating and drinking   Eat a balanced diet that includes fresh fruits and vegetables, whole grains, good sources of protein such as meat, eggs, or tofu,  and low-fat dairy. Your health care provider will help you determine the amount of weight gain that is right for you.  Avoid raw meat and uncooked cheese. These carry germs that can cause birth defects in the baby.  If you have low calcium intake from food, talk to your health care provider about whether you should take a daily calcium supplement.  Eat four or five small meals rather than three large meals a day.  Limit foods that are high in fat and processed sugars, such as fried and sweet foods.  To prevent constipation: ? Drink enough fluid to keep your urine clear or pale yellow. ? Eat foods that are high in fiber, such as fresh fruits and vegetables, whole grains, and beans. Activity  Exercise only as directed by your health care provider. Most women can continue their usual exercise routine during pregnancy. Try to exercise for 30 minutes at least 5 days a week. Stop exercising if you experience uterine contractions.  Avoid heavy lifting.  Do   not exercise in extreme heat or humidity, or at high altitudes.  Wear low-heel, comfortable shoes.  Practice good posture.  You may continue to have sex unless your health care provider tells you otherwise. Relieving pain and discomfort  Take frequent breaks and rest with your legs elevated if you have leg cramps or low back pain.  Take warm sitz baths to soothe any pain or discomfort caused by hemorrhoids. Use hemorrhoid cream if your health care provider approves.  Wear a good support bra to prevent discomfort from breast tenderness.  If you develop varicose veins: ? Wear support pantyhose or compression stockings as told by your healthcare provider. ? Elevate your feet for 15 minutes, 3-4 times a day. Prenatal care  Write down your questions. Take them to your prenatal visits.  Keep all your prenatal visits as told by your health care provider. This is important. Safety  Wear your seat belt at all times when driving.  Make  a list of emergency phone numbers, including numbers for family, friends, the hospital, and police and fire departments. General instructions  Avoid cat litter boxes and soil used by cats. These carry germs that can cause birth defects in the baby. If you have a cat, ask someone to clean the litter box for you.  Do not travel far distances unless it is absolutely necessary and only with the approval of your health care provider.  Do not use hot tubs, steam rooms, or saunas.  Do not drink alcohol.  Do not use any products that contain nicotine or tobacco, such as cigarettes and e-cigarettes. If you need help quitting, ask your health care provider.  Do not use any medicinal herbs or unprescribed drugs. These chemicals affect the formation and growth of the baby.  Do not douche or use tampons or scented sanitary pads.  Do not cross your legs for long periods of time.  To prepare for the arrival of your baby: ? Take prenatal classes to understand, practice, and ask questions about labor and delivery. ? Make a trial run to the hospital. ? Visit the hospital and tour the maternity area. ? Arrange for maternity or paternity leave through employers. ? Arrange for family and friends to take care of pets while you are in the hospital. ? Purchase a rear-facing car seat and make sure you know how to install it in your car. ? Pack your hospital bag. ? Prepare the baby's nursery. Make sure to remove all pillows and stuffed animals from the baby's crib to prevent suffocation.  Visit your dentist if you have not gone during your pregnancy. Use a soft toothbrush to brush your teeth and be gentle when you floss. Contact a health care provider if:  You are unsure if you are in labor or if your water has broken.  You become dizzy.  You have mild pelvic cramps, pelvic pressure, or nagging pain in your abdominal area.  You have lower back pain.  You have persistent nausea, vomiting, or diarrhea.   You have an unusual or bad smelling vaginal discharge.  You have pain when you urinate. Get help right away if:  Your water breaks before 37 weeks.  You have regular contractions less than 5 minutes apart before 37 weeks.  You have a fever.  You are leaking fluid from your vagina.  You have spotting or bleeding from your vagina.  You have severe abdominal pain or cramping.  You have rapid weight loss or weight gain.  You have   shortness of breath with chest pain.  You notice sudden or extreme swelling of your face, hands, ankles, feet, or legs.  Your baby makes fewer than 10 movements in 2 hours.  You have severe headaches that do not go away when you take medicine.  You have vision changes. Summary  The third trimester is from week 28 through week 40, months 7 through 9. The third trimester is a time when the unborn baby (fetus) is growing rapidly.  During the third trimester, your discomfort may increase as you and your baby continue to gain weight. You may have abdominal, leg, and back pain, sleeping problems, and an increased need to urinate.  During the third trimester your breasts will keep growing and they will continue to become tender. A yellow fluid (colostrum) may leak from your breasts. This is the first milk you are producing for your baby.  False labor is a condition in which you feel small, irregular tightenings of the muscles in the womb (contractions) that eventually go away. These are called Braxton Hicks contractions. Contractions may last for hours, days, or even weeks before true labor sets in.  Signs of labor can include: abdominal cramps; regular contractions that start at 10 minutes apart and become stronger and more frequent with time; watery or bloody mucus discharge that comes from the vagina; increased pelvic pressure and dull back pain; and leaking of amniotic fluid. This information is not intended to replace advice given to you by your health  care provider. Make sure you discuss any questions you have with your health care provider. Document Released: 04/01/2001 Document Revised: 07/29/2018 Document Reviewed: 05/13/2016 [employer]vier Patient Education  2020 [employer]  

## 2019-03-24 NOTE — Progress Notes (Signed)
Pt c/o increased pelvic pain x1 month. She also states she has been getting dizzy for the past week - especially when standing.

## 2019-03-25 ENCOUNTER — Ambulatory Visit (INDEPENDENT_AMBULATORY_CARE_PROVIDER_SITE_OTHER): Payer: Medicare Other | Admitting: Clinical

## 2019-03-25 ENCOUNTER — Other Ambulatory Visit (HOSPITAL_COMMUNITY): Payer: Self-pay | Admitting: *Deleted

## 2019-03-25 ENCOUNTER — Encounter (HOSPITAL_COMMUNITY): Payer: Self-pay

## 2019-03-25 ENCOUNTER — Ambulatory Visit (HOSPITAL_COMMUNITY): Payer: Medicare Other | Admitting: *Deleted

## 2019-03-25 ENCOUNTER — Ambulatory Visit (HOSPITAL_COMMUNITY)
Admission: RE | Admit: 2019-03-25 | Discharge: 2019-03-25 | Disposition: A | Discharge status: Home / Self Care | Payer: Medicare Other | Source: Ambulatory Visit | Attending: Obstetrics and Gynecology | Admitting: Obstetrics and Gynecology

## 2019-03-25 DIAGNOSIS — O0933 Supervision of pregnancy with insufficient antenatal care, third trimester: Secondary | ICD-10-CM | POA: Diagnosis not present

## 2019-03-25 DIAGNOSIS — F4310 Post-traumatic stress disorder, unspecified: Secondary | ICD-10-CM | POA: Insufficient documentation

## 2019-03-25 DIAGNOSIS — O24414 Gestational diabetes mellitus in pregnancy, insulin controlled: Secondary | ICD-10-CM

## 2019-03-25 DIAGNOSIS — F419 Anxiety disorder, unspecified: Secondary | ICD-10-CM

## 2019-03-25 DIAGNOSIS — Z8632 Personal history of gestational diabetes: Secondary | ICD-10-CM

## 2019-03-25 DIAGNOSIS — Z91410 Personal history of adult physical and sexual abuse: Secondary | ICD-10-CM

## 2019-03-25 DIAGNOSIS — Z658 Other specified problems related to psychosocial circumstances: Secondary | ICD-10-CM

## 2019-03-25 DIAGNOSIS — Z8619 Personal history of other infectious and parasitic diseases: Secondary | ICD-10-CM

## 2019-03-25 DIAGNOSIS — O09293 Supervision of pregnancy with other poor reproductive or obstetric history, third trimester: Secondary | ICD-10-CM

## 2019-03-25 DIAGNOSIS — O99213 Obesity complicating pregnancy, third trimester: Secondary | ICD-10-CM | POA: Diagnosis not present

## 2019-03-25 DIAGNOSIS — Z3A35 35 weeks gestation of pregnancy: Secondary | ICD-10-CM

## 2019-03-25 DIAGNOSIS — F4323 Adjustment disorder with mixed anxiety and depressed mood: Secondary | ICD-10-CM

## 2019-03-25 DIAGNOSIS — O0990 Supervision of high risk pregnancy, unspecified, unspecified trimester: Secondary | ICD-10-CM

## 2019-03-25 DIAGNOSIS — Z9189 Other specified personal risk factors, not elsewhere classified: Secondary | ICD-10-CM | POA: Insufficient documentation

## 2019-03-25 DIAGNOSIS — O359XX0 Maternal care for (suspected) fetal abnormality and damage, unspecified, not applicable or unspecified: Secondary | ICD-10-CM

## 2019-03-25 DIAGNOSIS — O24419 Gestational diabetes mellitus in pregnancy, unspecified control: Secondary | ICD-10-CM | POA: Diagnosis present

## 2019-03-25 NOTE — BH Specialist Note (Signed)
Integrated Behavioral Health Follow Up Visit  MRN: [id number] Name: Suzanne Bonilla  Number of [place] Clinician visits: 3/6 Session Start time: 10:20  Session End time: 11:00 Total time: 40   Type of Service: [place] Interpretor:No. Interpretor Name and Language: n/a  SUBJECTIVE: Curry Dulski is a 33 y.o. female accompanied by n/a Patient was referred by Darron Doom, MD for positive depression screen. Patient reports the following symptoms/concerns: Pt states her primary concern today is feeling as if "it hasn't sunk in yet" that she is having a baby as early as 2 weeks. Pt says she has almost everything ready for baby except for diapers, but housing issues are causing stress (insects, etc.). She has a friend who has offered to help with children during remainder of medical appointments. And she is taking 200mg  of Zoloft daily, instead of 100mg  as prescribed as that was her previous dosage, along with Wellbutrin, as prescribed.  Duration of problem: Current pregnancy; Severity of problem: moderately severe  OBJECTIVE: Mood: Normal and Affect: Appropriate Risk of harm to self or others: No plan to harm self or others  LIFE CONTEXT: Family and Social: Pt lives with her children (10yo, 89yo, 66yo) School/Work: - Self-Care: - Life Changes: Current pregnancy  GOALS ADDRESSED: Patient will: 1.  Reduce symptoms of: anxiety, depression and stress  2.  Increase knowledge and/or ability of: stress reduction  3.  Demonstrate ability to: Increase healthy adjustment to current life circumstances and Increase motivation to adhere to plan of care  INTERVENTIONS: Interventions utilized:  Medication Monitoring and Psychoeducation and/or Health Education   Standardized Assessments completed: Pt did PHQ9/GAD7 yesterday  ASSESSMENT: Patient currently experiencing Adjustment disorder with mixed anxiety and depressed mood and  Psychosocial stress.   Patient may benefit from continued brief therapeutic interventions regarding coping with symptoms of anxiety and depression, and life stress.  PLAN: 1. Follow up with behavioral health clinician on : As requested, or postpartum 2. Behavioral recommendations:  -Take BH medication as prescribed -Take home newborn diapers today -Consider housing resources provided for possible help with housing issue; continue working with [employer] about problems 3. Referral(s): [place] (In Clinic) 4. "From scale of 1-10, how likely are you to follow plan?": -  [name], LCSW  Depression screen [facility] 2/9 03/24/2019 03/10/2019  Decreased Interest 3 3  Down, Depressed, Hopeless 1 2  PHQ - 2 Score 4 5  Altered sleeping 3 3  Tired, decreased energy 3 3  Change in appetite 3 0  Feeling bad or failure about yourself  3 3  Trouble concentrating 0 0  Moving slowly or fidgety/restless 2 0  Suicidal thoughts 0 0  PHQ-9 Score 18 14   GAD 7 : Generalized Anxiety Score 03/24/2019 03/10/2019  Nervous, Anxious, on Edge 0 0  Control/stop worrying 2 3  Worry too much - different things 3 3  Trouble relaxing 2 3  Restless 1 0  Easily annoyed or irritable 3 3  Afraid - awful might happen 0 1  Total GAD 7 Score 11 13

## 2019-03-25 NOTE — Patient Instructions (Signed)
Housing Resources                    [employer] (serves [place], [place], [place], [place], [place], [place], [place], [place], [place], [place], [place], [place], and [place] counties) [address], [place], [place] [address] [phone] [email or web address]  **Rental assistance, Home Rehabilitation,Weatherization Assistance Program, Forensic psychologist, Housing Voucher Program   [employer] [place] [address], [place], [place] [address] [phone] [email or web address] [address] [place], [place] [address] [phone] [email or web address] **Programs include: Foreclosure Prevention and Housing Counseling, Healthy Homes/Tenant Advocacy, Homeless Prevention and [place], [place], [place] [address] [phone] [employer]no **Environmental Exposure Assessment (investigation of homes where either children or pregnant women with a confirmed elevated blood lead level reside)  [employer] [address], [place], [place] [address] [phone] [email or web address] **Offers credit-building and banking services to people unable to use traditional banking

## 2019-03-26 ENCOUNTER — Other Ambulatory Visit: Payer: Self-pay | Admitting: Family Medicine

## 2019-03-31 ENCOUNTER — Ambulatory Visit: Payer: Medicare Other | Admitting: *Deleted

## 2019-03-31 ENCOUNTER — Ambulatory Visit (INDEPENDENT_AMBULATORY_CARE_PROVIDER_SITE_OTHER): Payer: Medicare Other | Admitting: Obstetrics and Gynecology

## 2019-03-31 ENCOUNTER — Encounter: Payer: Self-pay | Admitting: Obstetrics and Gynecology

## 2019-03-31 ENCOUNTER — Encounter: Payer: Medicare Other | Attending: Family Medicine | Admitting: *Deleted

## 2019-03-31 ENCOUNTER — Other Ambulatory Visit (HOSPITAL_COMMUNITY)
Admission: RE | Admit: 2019-03-31 | Discharge: 2019-03-31 | Disposition: A | Discharge status: Home / Self Care | Payer: Medicare Other | Source: Ambulatory Visit | Attending: Obstetrics and Gynecology | Admitting: Obstetrics and Gynecology

## 2019-03-31 ENCOUNTER — Other Ambulatory Visit: Payer: Self-pay

## 2019-03-31 VITALS — BP 131/86 | HR 84 | Wt 344.0 lb

## 2019-03-31 DIAGNOSIS — Z713 Dietary counseling and surveillance: Secondary | ICD-10-CM | POA: Insufficient documentation

## 2019-03-31 DIAGNOSIS — O09899 Supervision of other high risk pregnancies, unspecified trimester: Secondary | ICD-10-CM

## 2019-03-31 DIAGNOSIS — O0990 Supervision of high risk pregnancy, unspecified, unspecified trimester: Secondary | ICD-10-CM | POA: Insufficient documentation

## 2019-03-31 DIAGNOSIS — Z8619 Personal history of other infectious and parasitic diseases: Secondary | ICD-10-CM

## 2019-03-31 DIAGNOSIS — O99891 Other specified diseases and conditions complicating pregnancy: Secondary | ICD-10-CM

## 2019-03-31 DIAGNOSIS — O0993 Supervision of high risk pregnancy, unspecified, third trimester: Secondary | ICD-10-CM

## 2019-03-31 DIAGNOSIS — O24419 Gestational diabetes mellitus in pregnancy, unspecified control: Secondary | ICD-10-CM | POA: Insufficient documentation

## 2019-03-31 DIAGNOSIS — O24414 Gestational diabetes mellitus in pregnancy, insulin controlled: Secondary | ICD-10-CM

## 2019-03-31 DIAGNOSIS — Z2839 Other underimmunization status: Secondary | ICD-10-CM

## 2019-03-31 DIAGNOSIS — Z3A35 35 weeks gestation of pregnancy: Secondary | ICD-10-CM

## 2019-03-31 DIAGNOSIS — Z283 Underimmunization status: Secondary | ICD-10-CM

## 2019-03-31 LAB — POCT URINALYSIS DIP (DEVICE)
Bilirubin Urine: NEGATIVE
Glucose, UA: NEGATIVE mg/dL
Hgb urine dipstick: NEGATIVE
Ketones, ur: NEGATIVE mg/dL
Leukocytes,Ua: NEGATIVE
Nitrite: NEGATIVE
Protein, ur: NEGATIVE mg/dL
Specific Gravity, Urine: 1.025 (ref 1.005–1.030)
Urobilinogen, UA: 0.2 mg/dL (ref 0.0–1.0)
pH: 6.5 (ref 5.0–8.0)
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
## 2019-03-31 NOTE — Progress Notes (Signed)
  Patient was seen on 03/31/2019 for Gestational Diabetes self-management. EDD 04/29/2019 but patient states she will be induced at 37 weeks. Patient states history of GDM with 2 of the last 3 pregnancies. Diet history obtained. Patient eats fair variety of all food groups. Beverages include water and milk with occasional fruit juices and Gingerale.  The following learning objectives were met by the patient :   States the definition of Gestational Diabetes  States why dietary management is important in controlling blood glucose  Describes the effects of carbohydrates on blood glucose levels  Demonstrates ability to create a balanced meal plan  Demonstrates carbohydrate counting   States when to check blood glucose levels  Demonstrates proper blood glucose monitoring techniques  States the effect of stress and exercise on blood glucose levels  States the importance of limiting caffeine and abstaining from alcohol and smoking  Plan:  Due to her height, aim for 3 - 4 Carb Choices per meal (45 - 60 grams)   Aim for 1-2 Carbs per snack Begin reading food labels for Total Carbohydrate of foods If OK with your MD, consider  increasing your activity level by walking, Arm Chair Exercises or other activity daily as tolerated Begin checking BG before breakfast and 2 hours after first bite of breakfast, lunch and dinner as directed by MD  Bring Log Book/Sheet and meter to every medical appointment Take medication if directed by MD  Patient is testing pre breakfast and 2 hours each meal as directed by MD MD reviewed her blood sugar information at earlier appointment today and confirmed she should continue with insulin doses @ 50 units Levemir at night and 15 units Novolog before each meal  Patient instructed to monitor glucose levels: FBS: 60 - 95 mg/dl 2 hour: <120 mg/dl  Patient received the following handouts:  Nutrition Diabetes and Pregnancy  Carbohydrate Counting List  Patient will  be seen for follow-up as needed.

## 2019-04-01 ENCOUNTER — Ambulatory Visit (HOSPITAL_COMMUNITY)
Admission: RE | Admit: 2019-04-01 | Discharge: 2019-04-01 | Disposition: A | Discharge status: Home / Self Care | Payer: Medicare Other | Source: Ambulatory Visit | Attending: Obstetrics and Gynecology | Admitting: Obstetrics and Gynecology

## 2019-04-01 ENCOUNTER — Ambulatory Visit (HOSPITAL_COMMUNITY): Payer: Medicare Other | Admitting: *Deleted

## 2019-04-01 ENCOUNTER — Encounter (HOSPITAL_COMMUNITY): Payer: Self-pay

## 2019-04-01 ENCOUNTER — Other Ambulatory Visit: Payer: Self-pay

## 2019-04-01 DIAGNOSIS — Z8619 Personal history of other infectious and parasitic diseases: Secondary | ICD-10-CM | POA: Insufficient documentation

## 2019-04-01 DIAGNOSIS — F419 Anxiety disorder, unspecified: Secondary | ICD-10-CM

## 2019-04-01 DIAGNOSIS — F4310 Post-traumatic stress disorder, unspecified: Secondary | ICD-10-CM

## 2019-04-01 DIAGNOSIS — Z8632 Personal history of gestational diabetes: Secondary | ICD-10-CM | POA: Insufficient documentation

## 2019-04-01 DIAGNOSIS — Z9189 Other specified personal risk factors, not elsewhere classified: Secondary | ICD-10-CM | POA: Diagnosis present

## 2019-04-01 DIAGNOSIS — O24414 Gestational diabetes mellitus in pregnancy, insulin controlled: Secondary | ICD-10-CM | POA: Diagnosis present

## 2019-04-01 DIAGNOSIS — Z91410 Personal history of adult physical and sexual abuse: Secondary | ICD-10-CM | POA: Insufficient documentation

## 2019-04-01 DIAGNOSIS — O99213 Obesity complicating pregnancy, third trimester: Secondary | ICD-10-CM

## 2019-04-01 DIAGNOSIS — O09293 Supervision of pregnancy with other poor reproductive or obstetric history, third trimester: Secondary | ICD-10-CM

## 2019-04-01 DIAGNOSIS — O359XX0 Maternal care for (suspected) fetal abnormality and damage, unspecified, not applicable or unspecified: Secondary | ICD-10-CM

## 2019-04-01 DIAGNOSIS — O0990 Supervision of high risk pregnancy, unspecified, unspecified trimester: Secondary | ICD-10-CM | POA: Diagnosis present

## 2019-04-01 DIAGNOSIS — O0933 Supervision of pregnancy with insufficient antenatal care, third trimester: Secondary | ICD-10-CM

## 2019-04-01 DIAGNOSIS — Z3A36 36 weeks gestation of pregnancy: Secondary | ICD-10-CM

## 2019-04-01 LAB — CERVICOVAGINAL ANCILLARY ONLY
Bacterial Vaginitis (gardnerella): NEGATIVE
Candida Glabrata: NEGATIVE
Candida Vaginitis: POSITIVE — AB
Chlamydia: NEGATIVE
Comment: NEGATIVE
Comment: NEGATIVE
Comment: NEGATIVE
Comment: NEGATIVE
Comment: NORMAL
Neisseria Gonorrhea: NEGATIVE

## 2019-04-01 NOTE — BH Specialist Note (Signed)
Pt did not arrive to video visit and did not answer the phone ; Voicemail is full, so unable to leave phone message; left MyChart message for patient.    [place] via Telemedicine Video Visit  04/01/2019 Suzanne Bonilla [id number]   [name]

## 2019-04-03 MED ORDER — TERCONAZOLE 0.8 % VA CREA: 1.0000 | TOPICAL_CREAM | Freq: Every day | VAGINAL | 0 refills | Status: DC

## 2019-04-03 NOTE — Addendum Note (Signed)
Addended by: Vivien Rota on: 04/03/2019 09:46 PM   Modules accepted: Orders

## 2019-04-04 LAB — CULTURE, BETA STREP (GROUP B ONLY): Strep Gp B Culture: NEGATIVE

## 2019-04-05 ENCOUNTER — Encounter (HOSPITAL_COMMUNITY): Payer: Self-pay

## 2019-04-05 ENCOUNTER — Other Ambulatory Visit (HOSPITAL_COMMUNITY): Payer: Medicare Other

## 2019-04-05 ENCOUNTER — Encounter: Payer: Self-pay | Admitting: Family Medicine

## 2019-04-05 ENCOUNTER — Ambulatory Visit: Payer: Medicare Other | Admitting: Clinical

## 2019-04-05 ENCOUNTER — Telehealth (HOSPITAL_COMMUNITY): Payer: Self-pay | Admitting: *Deleted

## 2019-04-05 DIAGNOSIS — Z5329 Procedure and treatment not carried out because of patient's decision for other reasons: Secondary | ICD-10-CM

## 2019-04-05 DIAGNOSIS — Z91199 Patient's noncompliance with other medical treatment and regimen due to unspecified reason: Secondary | ICD-10-CM

## 2019-04-05 NOTE — Patient Instructions (Signed)
Suzanne Bonilla  04/05/2019   Your procedure is scheduled on:  04/08/2019  Arrive at 27 at Entrance C on [address] at [facility]  and [employer]. You are invited to use the FREE valet parking or use the Visitor's parking deck.  Pick up the phone at the desk and dial [phone].  Call this number if you have problems the morning of surgery: [phone]  Remember:   Do not eat food:(After Midnight) Desps de medianoche.  Do not drink clear liquids: (After Midnight) Desps de medianoche.  Take these medicines the morning of surgery with A SIP OF WATER:  Take 25 units of insulin at bedtime the night before your surgery.  Take valtrex the morning of surgery as usual.  If you take your zoloft and or wellbutrin each morning, continue taking those.  No other medications the morning of surgery.    Do not wear jewelry, make-up or nail polish.  Do not wear lotions, powders, or perfumes. Do not wear deodorant.  Do not shave 48 hours prior to surgery.  Do not bring valuables to the hospital.  [facility] is not   responsible for any belongings or valuables brought to the hospital.  Contacts, dentures or bridgework may not be worn into surgery.  Leave suitcase in the car. After surgery it may be brought to your room.  For patients admitted to the hospital, checkout time is 11:00 AM the day of              discharge.      Please read over the following fact sheets that you were given:     Preparing for Surgery

## 2019-04-05 NOTE — Telephone Encounter (Signed)
Preadmission screen  

## 2019-04-06 ENCOUNTER — Other Ambulatory Visit (HOSPITAL_COMMUNITY)
Admission: RE | Admit: 2019-04-06 | Discharge: 2019-04-06 | Disposition: A | Discharge status: Home / Self Care | Payer: Medicare Other | Source: Ambulatory Visit | Attending: Family Medicine | Admitting: Family Medicine

## 2019-04-06 ENCOUNTER — Other Ambulatory Visit: Payer: Self-pay

## 2019-04-06 DIAGNOSIS — Z20828 Contact with and (suspected) exposure to other viral communicable diseases: Secondary | ICD-10-CM | POA: Insufficient documentation

## 2019-04-06 DIAGNOSIS — Z01812 Encounter for preprocedural laboratory examination: Secondary | ICD-10-CM | POA: Insufficient documentation

## 2019-04-06 LAB — COMPREHENSIVE METABOLIC PANEL
ALT: 16 U/L (ref 0–44)
AST: 21 U/L (ref 15–41)
Albumin: 2.5 g/dL — ABNORMAL LOW (ref 3.5–5.0)
Alkaline Phosphatase: 102 U/L (ref 38–126)
Anion gap: 10 (ref 5–15)
BUN: 11 mg/dL (ref 6–20)
CO2: 21 mmol/L — ABNORMAL LOW (ref 22–32)
Calcium: 9.1 mg/dL (ref 8.9–10.3)
Chloride: 104 mmol/L (ref 98–111)
Creatinine, Ser: 0.65 mg/dL (ref 0.44–1.00)
GFR calc Af Amer: >60 mL/min (ref >60)
GFR calc non Af Amer: >60 mL/min (ref >60)
Glucose, Bld: 126 mg/dL — ABNORMAL HIGH (ref 70–99)
Potassium: 3.9 mmol/L (ref 3.5–5.1)
Sodium: 135 mmol/L (ref 135–145)
Total Bilirubin: 0.2 mg/dL — ABNORMAL LOW (ref 0.3–1.2)
Total Protein: 6.4 g/dL — ABNORMAL LOW (ref 6.5–8.1)

## 2019-04-06 LAB — CBC
HCT: 35 % — ABNORMAL LOW (ref 36.0–46.0)
Hemoglobin: 10.8 g/dL — ABNORMAL LOW (ref 12.0–15.0)
MCH: 21.3 pg — ABNORMAL LOW (ref 26.0–34.0)
MCHC: 30.9 g/dL (ref 30.0–36.0)
MCV: 69.2 fL — ABNORMAL LOW (ref 80.0–100.0)
Platelets: 275 10*3/uL (ref 150–400)
RBC: 5.06 MIL/uL (ref 3.87–5.11)
RDW: 16.5 % — ABNORMAL HIGH (ref 11.5–15.5)
WBC: 8.8 10*3/uL (ref 4.0–10.5)
nRBC: 0 % (ref 0.0–0.2)

## 2019-04-06 LAB — SARS CORONAVIRUS 2 (TAT 6-24 HRS): SARS Coronavirus 2: NEGATIVE

## 2019-04-06 LAB — TYPE AND SCREEN
ABO/RH(D): B POS
Antibody Screen: NEGATIVE

## 2019-04-06 LAB — RPR: RPR Ser Ql: NONREACTIVE

## 2019-04-06 LAB — ABO/RH: ABO/RH(D): B POS

## 2019-04-06 NOTE — MAU Note (Signed)
Asymptomatic, swab collected. Lab called 

## 2019-04-07 ENCOUNTER — Telehealth (INDEPENDENT_AMBULATORY_CARE_PROVIDER_SITE_OTHER): Payer: Medicare Other | Admitting: Obstetrics and Gynecology

## 2019-04-07 ENCOUNTER — Encounter: Payer: Self-pay | Admitting: Obstetrics and Gynecology

## 2019-04-07 DIAGNOSIS — O09899 Supervision of other high risk pregnancies, unspecified trimester: Secondary | ICD-10-CM

## 2019-04-07 DIAGNOSIS — O24414 Gestational diabetes mellitus in pregnancy, insulin controlled: Secondary | ICD-10-CM | POA: Diagnosis not present

## 2019-04-07 DIAGNOSIS — Z3A36 36 weeks gestation of pregnancy: Secondary | ICD-10-CM

## 2019-04-07 DIAGNOSIS — O99891 Other specified diseases and conditions complicating pregnancy: Secondary | ICD-10-CM | POA: Diagnosis not present

## 2019-04-07 DIAGNOSIS — Z283 Underimmunization status: Secondary | ICD-10-CM

## 2019-04-07 DIAGNOSIS — O0993 Supervision of high risk pregnancy, unspecified, third trimester: Secondary | ICD-10-CM

## 2019-04-07 DIAGNOSIS — Z2839 Other underimmunization status: Secondary | ICD-10-CM

## 2019-04-07 DIAGNOSIS — Z3490 Encounter for supervision of normal pregnancy, unspecified, unspecified trimester: Secondary | ICD-10-CM

## 2019-04-07 DIAGNOSIS — O0990 Supervision of high risk pregnancy, unspecified, unspecified trimester: Secondary | ICD-10-CM
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
## 2019-04-07 NOTE — Progress Notes (Signed)
  Pt has Paper log for Glucose,

## 2019-04-08 ENCOUNTER — Encounter (HOSPITAL_COMMUNITY)
Admission: RE | Disposition: A | Discharge status: Home / Self Care | Payer: Self-pay | Source: Home / Self Care | Attending: Obstetrics and Gynecology

## 2019-04-08 ENCOUNTER — Inpatient Hospital Stay (HOSPITAL_COMMUNITY)
Admission: RE | Admit: 2019-04-08 | Discharge: 2019-04-11 | DRG: 788 | Disposition: A | Discharge status: Home / Self Care | Payer: Medicare Other | Attending: Obstetrics and Gynecology | Admitting: Obstetrics and Gynecology

## 2019-04-08 ENCOUNTER — Inpatient Hospital Stay (HOSPITAL_COMMUNITY): Payer: Medicare Other

## 2019-04-08 ENCOUNTER — Encounter (HOSPITAL_COMMUNITY): Payer: Self-pay | Admitting: Obstetrics and Gynecology

## 2019-04-08 ENCOUNTER — Other Ambulatory Visit: Payer: Self-pay

## 2019-04-08 ENCOUNTER — Ambulatory Visit (HOSPITAL_COMMUNITY): Payer: Medicare Other

## 2019-04-08 DIAGNOSIS — O99214 Obesity complicating childbirth: Secondary | ICD-10-CM | POA: Diagnosis present

## 2019-04-08 DIAGNOSIS — Z23 Encounter for immunization: Secondary | ICD-10-CM | POA: Diagnosis present

## 2019-04-08 DIAGNOSIS — O24424 Gestational diabetes mellitus in childbirth, insulin controlled: Secondary | ICD-10-CM | POA: Diagnosis present

## 2019-04-08 DIAGNOSIS — O9902 Anemia complicating childbirth: Secondary | ICD-10-CM | POA: Diagnosis present

## 2019-04-08 DIAGNOSIS — B009 Herpesviral infection, unspecified: Secondary | ICD-10-CM | POA: Diagnosis present

## 2019-04-08 DIAGNOSIS — Z20828 Contact with and (suspected) exposure to other viral communicable diseases: Secondary | ICD-10-CM | POA: Diagnosis present

## 2019-04-08 DIAGNOSIS — A6000 Herpesviral infection of urogenital system, unspecified: Secondary | ICD-10-CM | POA: Diagnosis present

## 2019-04-08 DIAGNOSIS — O98519 Other viral diseases complicating pregnancy, unspecified trimester: Secondary | ICD-10-CM | POA: Diagnosis present

## 2019-04-08 DIAGNOSIS — O34211 Maternal care for low transverse scar from previous cesarean delivery: Secondary | ICD-10-CM

## 2019-04-08 DIAGNOSIS — D649 Anemia, unspecified: Secondary | ICD-10-CM | POA: Diagnosis present

## 2019-04-08 DIAGNOSIS — O9832 Other infections with a predominantly sexual mode of transmission complicating childbirth: Principal | ICD-10-CM | POA: Diagnosis present

## 2019-04-08 DIAGNOSIS — Z3A37 37 weeks gestation of pregnancy: Secondary | ICD-10-CM

## 2019-04-08 DIAGNOSIS — O9852 Other viral diseases complicating childbirth: Secondary | ICD-10-CM

## 2019-04-08 DIAGNOSIS — Z87891 Personal history of nicotine dependence: Secondary | ICD-10-CM | POA: Diagnosis not present

## 2019-04-08 LAB — CBC
HCT: 32.5 % — ABNORMAL LOW (ref 36.0–46.0)
Hemoglobin: 10.4 g/dL — ABNORMAL LOW (ref 12.0–15.0)
MCH: 22.2 pg — ABNORMAL LOW (ref 26.0–34.0)
MCHC: 32 g/dL (ref 30.0–36.0)
MCV: 69.4 fL — ABNORMAL LOW (ref 80.0–100.0)
Platelets: 250 10*3/uL (ref 150–400)
RBC: 4.68 MIL/uL (ref 3.87–5.11)
RDW: 16.5 % — ABNORMAL HIGH (ref 11.5–15.5)
WBC: 12.3 10*3/uL — ABNORMAL HIGH (ref 4.0–10.5)
nRBC: 0 % (ref 0.0–0.2)

## 2019-04-08 LAB — CREATININE, SERUM
Creatinine, Ser: 0.79 mg/dL (ref 0.44–1.00)
GFR calc Af Amer: >60 mL/min (ref >60)
GFR calc non Af Amer: >60 mL/min (ref >60)

## 2019-04-08 LAB — GLUCOSE, CAPILLARY
Glucose-Capillary: 107 mg/dL — ABNORMAL HIGH (ref 70–99)
Glucose-Capillary: 106 mg/dL — ABNORMAL HIGH (ref 70–99)

## 2019-04-08 SURGERY — Surgical Case
Anesthesia: Spinal | Site: Abdomen | Wound class: Clean Contaminated

## 2019-04-08 MED ORDER — GLYCOPYRROLATE PF 0.2 MG/ML IJ SOSY
PREFILLED_SYRINGE | INTRAMUSCULAR | Status: AC
  Filled 2019-04-08: qty 1

## 2019-04-08 MED ORDER — SIMETHICONE 80 MG PO CHEW
80.0000 mg | CHEWABLE_TABLET | ORAL | Status: DC
  Administered 2019-04-09 – 2019-04-11 (×3): 80 mg via ORAL
  Filled 2019-04-08 (×4): qty 1

## 2019-04-08 MED ORDER — STERILE WATER FOR IRRIGATION IR SOLN
Status: DC | PRN
  Administered 2019-04-08: 1000 mL

## 2019-04-08 MED ORDER — DIPHENHYDRAMINE HCL 25 MG PO CAPS
25.0000 mg | ORAL_CAPSULE | Freq: Four times a day (QID) | ORAL | Status: DC | PRN
  Administered 2019-04-08: 25 mg via ORAL

## 2019-04-08 MED ORDER — WITCH HAZEL-GLYCERIN EX PADS: 1.0000 "application " | MEDICATED_PAD | CUTANEOUS | Status: DC | PRN

## 2019-04-08 MED ORDER — MORPHINE SULFATE (PF) 0.5 MG/ML IJ SOLN
INTRAMUSCULAR | Status: AC
  Filled 2019-04-08: qty 10

## 2019-04-08 MED ORDER — DIPHENHYDRAMINE HCL 50 MG/ML IJ SOLN
INTRAMUSCULAR | Status: DC | PRN
  Administered 2019-04-08: 25 mg via INTRAVENOUS

## 2019-04-08 MED ORDER — ENOXAPARIN SODIUM 80 MG/0.8ML ~~LOC~~ SOLN
0.5000 mg/kg | SUBCUTANEOUS | Status: DC
  Administered 2019-04-09 – 2019-04-11 (×3): 80 mg via SUBCUTANEOUS
  Filled 2019-04-08 (×3): qty 0.8

## 2019-04-08 MED ORDER — GLYCOPYRROLATE 0.2 MG/ML IJ SOLN
INTRAMUSCULAR | Status: DC | PRN
  Administered 2019-04-08: 31.2 ug via INTRAVENOUS

## 2019-04-08 MED ORDER — FERROUS SULFATE 325 (65 FE) MG PO TABS
325.0000 mg | ORAL_TABLET | Freq: Two times a day (BID) | ORAL | Status: DC
  Administered 2019-04-09 – 2019-04-11 (×5): 325 mg via ORAL
  Filled 2019-04-08 (×6): qty 1

## 2019-04-08 MED ORDER — ETONOGESTREL 68 MG ~~LOC~~ IMPL
68.0000 mg | DRUG_IMPLANT | Freq: Once | SUBCUTANEOUS | Status: DC
  Filled 2019-04-08: qty 1

## 2019-04-08 MED ORDER — SOD CITRATE-CITRIC ACID 500-334 MG/5ML PO SOLN
30.0000 mL | ORAL | Status: AC
  Administered 2019-04-08: 30 mL via ORAL

## 2019-04-08 MED ORDER — SODIUM CHLORIDE 0.9% FLUSH: 3.0000 mL | INTRAVENOUS | Status: DC | PRN

## 2019-04-08 MED ORDER — SCOPOLAMINE 1 MG/3DAYS TD PT72
MEDICATED_PATCH | TRANSDERMAL | Status: AC
  Filled 2019-04-08: qty 1

## 2019-04-08 MED ORDER — ONDANSETRON HCL 4 MG/2ML IJ SOLN
INTRAMUSCULAR | Status: DC | PRN
  Administered 2019-04-08: 4 mg via INTRAVENOUS

## 2019-04-08 MED ORDER — PHENYLEPHRINE HCL (PRESSORS) 10 MG/ML IV SOLN
INTRAVENOUS | Status: DC | PRN
  Administered 2019-04-08 (×5): 80 ug via INTRAVENOUS

## 2019-04-08 MED ORDER — PRENATAL MULTIVITAMIN CH
1.0000 | ORAL_TABLET | Freq: Every day | ORAL | Status: DC
  Administered 2019-04-09 – 2019-04-11 (×3): 1 via ORAL
  Filled 2019-04-08 (×3): qty 1

## 2019-04-08 MED ORDER — OXYCODONE HCL 5 MG PO TABS: 5.0000 mg | ORAL_TABLET | Freq: Once | ORAL | Status: DC | PRN

## 2019-04-08 MED ORDER — EPHEDRINE 5 MG/ML INJ
INTRAVENOUS | Status: AC
  Filled 2019-04-08: qty 10

## 2019-04-08 MED ORDER — METOCLOPRAMIDE HCL 5 MG/ML IJ SOLN
INTRAMUSCULAR | Status: DC | PRN
  Administered 2019-04-08: 10 mg via INTRAVENOUS

## 2019-04-08 MED ORDER — EPHEDRINE SULFATE 50 MG/ML IJ SOLN
INTRAMUSCULAR | Status: DC | PRN
  Administered 2019-04-08 (×3): 5 mg via INTRAVENOUS

## 2019-04-08 MED ORDER — LIDOCAINE HCL 1 % IJ SOLN
0.0000 mL | Freq: Once | INTRAMUSCULAR | Status: DC | PRN
  Filled 2019-04-08: qty 20

## 2019-04-08 MED ORDER — SOD CITRATE-CITRIC ACID 500-334 MG/5ML PO SOLN
ORAL | Status: AC
  Filled 2019-04-08: qty 30

## 2019-04-08 MED ORDER — LACTATED RINGERS IV SOLN: INTRAVENOUS | Status: DC

## 2019-04-08 MED ORDER — OXYCODONE HCL 5 MG/5ML PO SOLN: 5.0000 mg | Freq: Once | ORAL | Status: DC | PRN

## 2019-04-08 MED ORDER — SENNOSIDES-DOCUSATE SODIUM 8.6-50 MG PO TABS
2.0000 | ORAL_TABLET | ORAL | Status: DC
  Administered 2019-04-09 – 2019-04-11 (×3): 2 via ORAL
  Filled 2019-04-08 (×3): qty 2

## 2019-04-08 MED ORDER — DIBUCAINE (PERIANAL) 1 % EX OINT: 1.0000 "application " | TOPICAL_OINTMENT | CUTANEOUS | Status: DC | PRN

## 2019-04-08 MED ORDER — SIMETHICONE 80 MG PO CHEW: 80.0000 mg | CHEWABLE_TABLET | ORAL | Status: DC | PRN

## 2019-04-08 MED ORDER — DEXTROSE 5 % IV SOLN: 3.0000 g | INTRAVENOUS | Status: DC

## 2019-04-08 MED ORDER — HYDROMORPHONE HCL 1 MG/ML IJ SOLN: 0.2000 mg | INTRAMUSCULAR | Status: DC | PRN

## 2019-04-08 MED ORDER — FENTANYL CITRATE (PF) 100 MCG/2ML IJ SOLN: 25.0000 ug | INTRAMUSCULAR | Status: DC | PRN

## 2019-04-08 MED ORDER — DIPHENHYDRAMINE HCL 50 MG/ML IJ SOLN
INTRAMUSCULAR | Status: AC
  Filled 2019-04-08: qty 1

## 2019-04-08 MED ORDER — ACETAMINOPHEN 500 MG PO TABS
1000.0000 mg | ORAL_TABLET | Freq: Four times a day (QID) | ORAL | Status: DC
  Administered 2019-04-08 – 2019-04-11 (×12): 1000 mg via ORAL
  Filled 2019-04-08 (×13): qty 2

## 2019-04-08 MED ORDER — KETOROLAC TROMETHAMINE 30 MG/ML IJ SOLN
30.0000 mg | Freq: Four times a day (QID) | INTRAMUSCULAR | Status: AC
  Administered 2019-04-09: 30 mg via INTRAVENOUS
  Filled 2019-04-08 (×2): qty 1

## 2019-04-08 MED ORDER — TETANUS-DIPHTH-ACELL PERTUSSIS 5-2.5-18.5 LF-MCG/0.5 IM SUSP: 0.5000 mL | Freq: Once | INTRAMUSCULAR | Status: DC

## 2019-04-08 MED ORDER — MENTHOL 3 MG MT LOZG: 1.0000 | LOZENGE | OROMUCOSAL | Status: DC | PRN

## 2019-04-08 MED ORDER — KETOROLAC TROMETHAMINE 30 MG/ML IJ SOLN
30.0000 mg | Freq: Once | INTRAMUSCULAR | Status: AC
  Administered 2019-04-08: 30 mg via INTRAVENOUS

## 2019-04-08 MED ORDER — NALOXONE HCL 4 MG/10ML IJ SOLN
1.0000 ug/kg/h | INTRAVENOUS | Status: DC | PRN
  Filled 2019-04-08: qty 5

## 2019-04-08 MED ORDER — ONDANSETRON HCL 4 MG/2ML IJ SOLN
INTRAMUSCULAR | Status: AC
  Filled 2019-04-08: qty 2

## 2019-04-08 MED ORDER — PHENYLEPHRINE HCL-NACL 20-0.9 MG/250ML-% IV SOLN
INTRAVENOUS | Status: DC | PRN
  Administered 2019-04-08: 60 ug/min via INTRAVENOUS

## 2019-04-08 MED ORDER — ZOLPIDEM TARTRATE 5 MG PO TABS: 5.0000 mg | ORAL_TABLET | Freq: Every evening | ORAL | Status: DC | PRN

## 2019-04-08 MED ORDER — NALBUPHINE HCL 10 MG/ML IJ SOLN
5.0000 mg | Freq: Once | INTRAMUSCULAR | Status: AC | PRN
  Administered 2019-04-09: 5 mg via INTRAVENOUS

## 2019-04-08 MED ORDER — OXYTOCIN 40 UNITS IN NORMAL SALINE INFUSION - SIMPLE MED
INTRAVENOUS | Status: DC | PRN
  Administered 2019-04-08: 40 mL via INTRAVENOUS

## 2019-04-08 MED ORDER — OXYCODONE HCL 5 MG PO TABS
5.0000 mg | ORAL_TABLET | ORAL | Status: DC | PRN
  Administered 2019-04-09 (×3): 5 mg via ORAL
  Administered 2019-04-10 – 2019-04-11 (×7): 10 mg via ORAL
  Filled 2019-04-08: qty 1
  Filled 2019-04-08 (×2): qty 2
  Filled 2019-04-08: qty 1
  Filled 2019-04-08 (×2): qty 2
  Filled 2019-04-08: qty 1
  Filled 2019-04-08 (×3): qty 2

## 2019-04-08 MED ORDER — DEXTROSE 5 % IV SOLN
INTRAVENOUS | Status: DC | PRN
  Administered 2019-04-08: 3 g via INTRAVENOUS

## 2019-04-08 MED ORDER — DIPHENHYDRAMINE HCL 50 MG/ML IJ SOLN: 12.5000 mg | INTRAMUSCULAR | Status: DC | PRN

## 2019-04-08 MED ORDER — BUPIVACAINE HCL (PF) 0.5 % IJ SOLN
INTRAMUSCULAR | Status: AC
  Filled 2019-04-08: qty 30

## 2019-04-08 MED ORDER — FENTANYL CITRATE (PF) 100 MCG/2ML IJ SOLN
INTRAMUSCULAR | Status: DC | PRN
  Administered 2019-04-08: 50 ug via INTRATHECAL
  Administered 2019-04-08: 15 ug via INTRATHECAL
  Administered 2019-04-08: 35 ug via INTRATHECAL

## 2019-04-08 MED ORDER — DIPHENHYDRAMINE HCL 25 MG PO CAPS
25.0000 mg | ORAL_CAPSULE | ORAL | Status: DC | PRN
  Filled 2019-04-08: qty 1

## 2019-04-08 MED ORDER — KETOROLAC TROMETHAMINE 30 MG/ML IJ SOLN
INTRAMUSCULAR | Status: AC
  Filled 2019-04-08: qty 1

## 2019-04-08 MED ORDER — FENTANYL CITRATE (PF) 100 MCG/2ML IJ SOLN
INTRAMUSCULAR | Status: AC
  Filled 2019-04-08: qty 2

## 2019-04-08 MED ORDER — SIMETHICONE 80 MG PO CHEW
80.0000 mg | CHEWABLE_TABLET | Freq: Three times a day (TID) | ORAL | Status: DC
  Administered 2019-04-08 – 2019-04-11 (×8): 80 mg via ORAL
  Filled 2019-04-08 (×8): qty 1

## 2019-04-08 MED ORDER — INFLUENZA VAC SPLIT QUAD 0.5 ML IM SUSY
0.5000 mL | PREFILLED_SYRINGE | INTRAMUSCULAR | Status: AC
  Administered 2019-04-11: 0.5 mL via INTRAMUSCULAR
  Filled 2019-04-08: qty 0.5

## 2019-04-08 MED ORDER — NALBUPHINE HCL 10 MG/ML IJ SOLN: 5.0000 mg | Freq: Once | INTRAMUSCULAR | Status: AC | PRN

## 2019-04-08 MED ORDER — TRANEXAMIC ACID-NACL 1000-0.7 MG/100ML-% IV SOLN
INTRAVENOUS | Status: DC | PRN
  Administered 2019-04-08: 1000 mg via INTRAVENOUS

## 2019-04-08 MED ORDER — MEPERIDINE HCL 25 MG/ML IJ SOLN: 6.2500 mg | INTRAMUSCULAR | Status: DC | PRN

## 2019-04-08 MED ORDER — IBUPROFEN 800 MG PO TABS
800.0000 mg | ORAL_TABLET | Freq: Four times a day (QID) | ORAL | Status: DC
  Administered 2019-04-09: 800 mg via ORAL
  Filled 2019-04-08 (×2): qty 1

## 2019-04-08 MED ORDER — ONDANSETRON HCL 4 MG/2ML IJ SOLN: 4.0000 mg | Freq: Three times a day (TID) | INTRAMUSCULAR | Status: DC | PRN

## 2019-04-08 MED ORDER — NALOXONE HCL 0.4 MG/ML IJ SOLN: 0.4000 mg | INTRAMUSCULAR | Status: DC | PRN

## 2019-04-08 MED ORDER — NALBUPHINE HCL 10 MG/ML IJ SOLN: 5.0000 mg | INTRAMUSCULAR | Status: DC | PRN

## 2019-04-08 MED ORDER — COCONUT OIL OIL: 1.0000 "application " | TOPICAL_OIL | Status: DC | PRN

## 2019-04-08 MED ORDER — BUPIVACAINE IN DEXTROSE 0.75-8.25 % IT SOLN
INTRATHECAL | Status: DC | PRN
  Administered 2019-04-08: 2 mL via INTRATHECAL

## 2019-04-08 MED ORDER — SCOPOLAMINE 1 MG/3DAYS TD PT72
1.0000 | MEDICATED_PATCH | Freq: Once | TRANSDERMAL | Status: AC
  Administered 2019-04-08: 15:00:00 1.5 mg via TRANSDERMAL

## 2019-04-08 MED ORDER — ONDANSETRON HCL 4 MG/2ML IJ SOLN: 4.0000 mg | Freq: Once | INTRAMUSCULAR | Status: DC | PRN

## 2019-04-08 MED ORDER — PHENYLEPHRINE 40 MCG/ML (10ML) SYRINGE FOR IV PUSH (FOR BLOOD PRESSURE SUPPORT)
PREFILLED_SYRINGE | INTRAVENOUS | Status: AC
  Filled 2019-04-08: qty 10

## 2019-04-08 MED ORDER — DEXTROSE 5 % IV SOLN
INTRAVENOUS | Status: AC
  Filled 2019-04-08: qty 3000

## 2019-04-08 MED ORDER — SODIUM CHLORIDE 0.9 % IR SOLN
Status: DC | PRN
  Administered 2019-04-08: 1000 mL

## 2019-04-08 MED ORDER — OXYTOCIN 40 UNITS IN NORMAL SALINE INFUSION - SIMPLE MED: 2.5000 [IU]/h | INTRAVENOUS | Status: AC

## 2019-04-08 MED ORDER — MORPHINE SULFATE (PF) 0.5 MG/ML IJ SOLN
INTRAMUSCULAR | Status: DC | PRN
  Administered 2019-04-08: .15 mg via INTRATHECAL

## 2019-04-08 MED ORDER — SODIUM CHLORIDE 0.9 % IV SOLN: INTRAVENOUS | Status: DC | PRN

## 2019-04-08 MED ORDER — METOCLOPRAMIDE HCL 5 MG/ML IJ SOLN
INTRAMUSCULAR | Status: AC
  Filled 2019-04-08: qty 2

## 2019-04-08 MED ORDER — NALBUPHINE HCL 10 MG/ML IJ SOLN
5.0000 mg | INTRAMUSCULAR | Status: DC | PRN
  Filled 2019-04-08: qty 1

## 2019-04-08 SURGICAL SUPPLY — 37 items
BENZOIN TINCTURE PRP APPL 2/3 (GAUZE/BANDAGES/DRESSINGS) IMPLANT
CHLORAPREP W/TINT 26ML (MISCELLANEOUS) ×2 IMPLANT
CLAMP CORD UMBIL (MISCELLANEOUS) IMPLANT
CLOTH BEACON ORANGE TIMEOUT ST (SAFETY) ×2 IMPLANT
DRSG OPSITE POSTOP 4X10 (GAUZE/BANDAGES/DRESSINGS) ×2 IMPLANT
ELECT REM PT RETURN 9FT ADLT (ELECTROSURGICAL) ×2
ELECTRODE REM PT RTRN 9FT ADLT (ELECTROSURGICAL) ×1 IMPLANT
GLOVE BIOGEL PI IND STRL 6.5 (GLOVE) ×1 IMPLANT
GLOVE BIOGEL PI IND STRL 7.0 (GLOVE) ×2 IMPLANT
GLOVE BIOGEL PI INDICATOR 6.5 (GLOVE) ×1
GLOVE BIOGEL PI INDICATOR 7.0 (GLOVE) ×2
GLOVE ORTHOPEDIC STR SZ6.5 (GLOVE) ×2 IMPLANT
GOWN STRL REUS W/TWL LRG LVL3 (GOWN DISPOSABLE) ×6 IMPLANT
HEMOSTAT ARISTA ABSORB 3G PWDR (HEMOSTASIS) ×1 IMPLANT
HEMOSTAT SURGICEL 4X8 (HEMOSTASIS) ×1 IMPLANT
HOVERMATT SINGLE USE (MISCELLANEOUS) ×1 IMPLANT
KIT PREVENA INCISION MGT20CM45 (CANNISTER) ×1 IMPLANT
NEEDLE HYPO 22GX1.5 SAFETY (NEEDLE) ×2 IMPLANT
NS IRRIG 1000ML POUR BTL (IV SOLUTION) ×2 IMPLANT
PACK C SECTION WH (CUSTOM PROCEDURE TRAY) ×2 IMPLANT
PAD OB MATERNITY 4.3X12.25 (PERSONAL CARE ITEMS) ×2 IMPLANT
PENCIL SMOKE EVAC W/HOLSTER (ELECTROSURGICAL) ×2 IMPLANT
RETRACTOR TRAXI PANNICULUS (MISCELLANEOUS) IMPLANT
STRIP CLOSURE SKIN 1/2X4 (GAUZE/BANDAGES/DRESSINGS) IMPLANT
SUT MON AB 4-0 PS1 27 (SUTURE) ×2 IMPLANT
SUT PLAIN 2 0 (SUTURE) ×1
SUT PLAIN ABS 2-0 CT1 27XMFL (SUTURE) ×1 IMPLANT
SUT VIC AB 0 CT1 36 (SUTURE) ×5 IMPLANT
SUT VIC AB 0 CTX 36 (SUTURE) ×1
SUT VIC AB 0 CTX36XBRD ANBCTRL (SUTURE) ×1 IMPLANT
SUT VICRYL 2 0 18  TIES (SUTURE) ×1
SUT VICRYL 2 0 18 TIES (SUTURE) IMPLANT
SYR CONTROL 10ML LL (SYRINGE) ×2 IMPLANT
TOWEL OR 17X24 6PK STRL BLUE (TOWEL DISPOSABLE) ×2 IMPLANT
TRAXI PANNICULUS RETRACTOR (MISCELLANEOUS) ×1
TRAY FOLEY W/BAG SLVR 14FR LF (SET/KITS/TRAYS/PACK) ×2 IMPLANT
WATER STERILE IRR 1000ML POUR (IV SOLUTION) ×2 IMPLANT

## 2019-04-08 NOTE — Op Note (Addendum)
Operative Note   SURGERY DATE: 04/08/2019  PRE-OP DIAGNOSIS:  *Pregnancy at 37 weeks *Active genital HSV *A2GDM, on insulin *Elective primary Cesarean section  POST-OP DIAGNOSIS:  *Pregnancy at 37 weeks *Active genital HSV *A2GDM, on insulin *Cesarean delivery, delivered  PROCEDURE: primary low transverse cesarean section via pfannenstiel skin incision with double layer uterine closure, lysis of omental adhesions  SURGEON: Surgeon(s) and Role:    * Conan Bowens, MD - Primary    * Sparacino, Hailey L, DO - Fellow  ASSISTANT: None  ANESTHESIA: spinal, 0.5% Marcaine 30 cc  ESTIMATED BLOOD LOSS:  619 mL  DRAINS: 150 mL UOP via indwelling foley  TOTAL IV FLUIDS: 1600 mL crystalloid  VTE PROPHYLAXIS: SCDs to bilateral lower extremities  ANTIBIOTICS: 3 grams Ancef, within 1 hour of skin incision  MEDICATIONS: 1 g TXA after delivery of placenta  SPECIMENS: None  COMPLICATIONS: None  INDICATIONS: Primary elective Cesarean section 2/2 Active Genital HSV  FINDINGS: Anterior wall omental adhesions behind the rectus muscles were noted. Grossly normal uterus, tubes and ovaries. Clear amniotic fluid, cephalic female infant, weight per medical record, APGARs 8/9, intact placenta.  PROCEDURE IN DETAIL: The patient was taken to the operating room where anesthesia was administered and normal fetal heart tones were confirmed. She was then prepped and draped in the normal fashion in the dorsal supine position with a leftward tilt.  After a time out was performed, a pfannensteil skin incision was made with the scalpel and carried through to the underlying layer of fascia. The fascia was then incised at the midline and this incision was extended laterally with the mayo scissors. Attention was turned to the inferior aspect of the fascial incision which was grasped with the kocher clamps x 2, tented up and the rectus muscles were dissected off with the Mayo scissors and bluntly. In a similar  fashion the superior aspect of the fascial incision was grasped with the kocher clamps, tented up and the rectus muscles dissected off with the mayo scissors. The rectus muscles were then separated in the midline and the peritoneum was entered bluntly. There were omental adhesions noted just posterior to the rectus muscles, one small section was doubly ligated with 2-0 vicryl and transected with the cautery, hemostasis noted at either side. This enabled the omentum to be pushed out of the field of view into the upper abdomen. The Alexis retractor was inserted and the vesicouterine peritoneum was identified.  A low transverse hysterotomy was made with the scalpel until the endometrial cavity was breached and the amniotic sac ruptured with the Allis clamp, yielding clear amniotic fluid. This incision was extended bluntly and the infant's head, shoulders and body were delivered atraumatically.The cord was clamped x 2 and cut, and the infant was handed to the awaiting pediatricians, after delayed cord clamping was done.  The placenta was then gradually expressed from the uterus and then the uterus was cleared of all clots and debris. The hysterotomy was repaired with a running suture of 0 Vicryl. A second imbricating layer of 0 Vicryl suture was then placed. Several figure-of-eight sutures of 0 Vicryl and Surgicel were added to achieve excellent hemostasis.   The hysterotomy and all operative sites were reinspected and excellent hemostasis was noted after cauterization of superficial percolating vessels. The area where the omentum had been transected was again visualized and hemostasis noted.   The fascia was reapproximated with 0 PDS in a simple running fashion bilaterally. The subcutaneous layer was then reapproximated with a running  suture of 2-0 plain gut, and the skin was then closed with 4-0 monocryl, in a subcuticular fashion.  The patient  tolerated the procedure well. Sponge, lap, needle, and instrument  counts were correct x 2. The patient was transferred to the recovery room awake, alert and breathing independently in stable condition.  Merilyn Baba, DO OB Fellow Center for [employer] [facility])   Attestation of Attending Supervision of OB Fellow: Evaluation, management, and procedures were performed by the [facility] Fellow under my supervision and collaboration. I was scrubbed and present for all portions of this procedure. I agree with the documentation and plan.  Feliz Beam, M.D. Attending Center for [employer] (Faculty Practice)  04/08/2019 5:38 PM

## 2019-04-08 NOTE — Anesthesia Postprocedure Evaluation (Signed)
Anesthesia Post Note  Patient: Suzanne Bonilla  Procedure(s) Performed: CESAREAN SECTION (N/A Abdomen)     Patient location during evaluation: PACU Anesthesia Type: Spinal Level of consciousness: oriented and awake and alert Pain management: pain level controlled Vital Signs Assessment: post-procedure vital signs reviewed and stable Respiratory status: spontaneous breathing, respiratory function stable and nonlabored ventilation Cardiovascular status: blood pressure returned to baseline and stable Postop Assessment: no headache, no backache, no apparent nausea or vomiting and spinal receding Anesthetic complications: no    Last Vitals:  Vitals:   04/08/19 1600 04/08/19 1617  BP: 115/73 115/69  Pulse: 86 76  Resp: 19 18  Temp:  36.7 C  SpO2: 98% 100%    Last Pain:  Vitals:   04/08/19 1617  TempSrc: Oral   Pain Goal:                   Lidia Collum

## 2019-04-08 NOTE — Anesthesia Procedure Notes (Signed)
Spinal  Patient location during procedure: OR Staffing Performed: anesthesiologist  Anesthesiologist: Taeden Geller E, MD Preanesthetic Checklist Completed: patient identified, IV checked, risks and benefits discussed, surgical consent, monitors and equipment checked, pre-op evaluation and timeout performed Spinal Block Patient position: sitting Prep: DuraPrep and site prepped and draped Patient monitoring: continuous pulse ox, blood pressure and heart rate Approach: midline Location: L3-4 Injection technique: single-shot Needle Needle type: Pencan  Needle gauge: 24 G Needle length: 9 cm Additional Notes Functioning IV was confirmed and monitors were applied. Sterile prep and drape, including hand hygiene and sterile gloves were used. The patient was positioned and the spine was prepped. The skin was anesthetized with lidocaine.  Free flow of clear CSF was obtained prior to injecting local anesthetic into the CSF. The needle was carefully withdrawn. The patient tolerated the procedure well.      

## 2019-04-08 NOTE — Transfer of Care (Signed)
Immediate Anesthesia Transfer of Care Note  Patient: Suzanne Bonilla  Procedure(s) Performed: CESAREAN SECTION (N/A Abdomen)  Patient Location: PACU  Anesthesia Type:Spinal  Level of Consciousness: awake, alert  and oriented  Airway & Oxygen Therapy: Patient Spontanous Breathing  Post-op Assessment: Report given to RN and Post -op Vital signs reviewed and stable  Post vital signs: Reviewed and stable  Last Vitals:  Vitals Value Taken Time  BP 118/61 04/08/19 1502  Temp    Pulse 75 04/08/19 1513  Resp 20 04/08/19 1513  SpO2 99 % 04/08/19 1513  Vitals shown include unvalidated device data.  Last Pain:  Vitals:   04/08/19 1054  TempSrc: Oral         Complications: No apparent anesthesia complications
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
## 2019-04-08 NOTE — Lactation Note (Signed)
This note was copied from a baby's chart. Lactation Consultation Note  Patient Name: Suzanne Bonilla FTDDU'[id number] Date: 04/08/2019 Reason for consult: Initial assessment;Early term 37-38.6wks;Maternal endocrine disorder Type of Endocrine Disorder?: Diabetes(GDM (insulin))  4 hours old ETI female who is being exclusively BF by his mother, she's a P4. She didn't BF baby # 1 and # 2, mom said she "tried" but couldn't due to a difficult latch, so she just gave babies formula. She BF baby # 3 for 6-7 months and plans to do breast/formula for this baby. She participated in the [facility] the [facility]. She had GDM on insulin during this pregnancy, but first baby's serum glucose is 65 and WNL, still awaiting the second serum glucose reading. Mom was on Zolof (an L2) and Wellbutrin an L3).  Mom doing STS with baby when entering the room, offered assistance with latch, but mom politely declined stating that baby already fed. Asked mom to call for assistance when needed. LC offered assistance with hand expression and mom agreed, she was able to do teach back and obtain several droplets of colostrum praised her for her efforts. Reviewed normal newborn behavior, feeding cues, cluster feeding and breastmilk storage guidelines.   Mom also requested breast pads and a hand pump, she told LC she was familiar with the Harmony hand pump; it's the same one she got in the [facility] office with her other babies. Mom was getting sleepy during [facility] consultation, and offered to put baby back to his bassinet but mom declined. Advised her to call her RN to put baby back in the bassinet to avoid falling asleep, discussed safe sleep practices.  Feeding plan:  1. Encouraged mom to feed baby STS 8-12 times/24 hours or sooner if feeding cues are present 2. Hand expression/pumping and spoon feeding were also encouraged  No support person present in the room at the time of [facility] consultation. BF brochure, BF resources and feeding diary were  reviewed. Mom reported all questions and concerns were answered, she's aware of [place] OP services and will call PRN.    Maternal Data Formula Feeding for Exclusion: Yes Reason for exclusion: Mother's choice to formula and breast feed on admission Has patient been taught Hand Expression?: Yes Does the patient have breastfeeding experience prior to this delivery?: Yes  Feeding Feeding Type: Breast Fed  LATCH Score Latch: Grasps breast easily, tongue down, lips flanged, rhythmical sucking.  Audible Swallowing: A few with stimulation  Type of Nipple: Everted at rest and after stimulation  Comfort (Breast/Nipple): Soft / non-tender  Hold (Positioning): Assistance needed to correctly position infant at breast and maintain latch.  LATCH Score: 8  Interventions Interventions: Breast feeding basics reviewed;Skin to skin;Breast massage;Hand express;Breast compression;Hand pump  Lactation Tools Discussed/Used Tools: Pump Breast pump type: Manual WIC Program: Yes Pump Review: Setup, frequency, and cleaning;Milk Storage Initiated by:: MPeck Date initiated:: 04/08/19   Consult Status Consult Status: Follow-up Date: 04/09/19 Follow-up type: In-patient    Suzanne Bonilla Francene Boyers 04/08/2019, 6:46 PM

## 2019-04-08 NOTE — Discharge Summary (Addendum)
Postpartum Discharge Summary     Patient Name: Suzanne Bonilla DOB: 1985/05/28 MRN: [id number]  Date of admission: 04/08/2019 Delivering Provider: Sloan Leiter   Date of discharge: 04/11/2019  Admitting diagnosis: Herpes simplex infection during pregnancy [O98.519, B00.9] Cesarean delivery delivered [O82] Intrauterine pregnancy: [redacted]w[redacted]d    Secondary diagnosis:  Active Problems:   Herpes simplex infection during pregnancy   Cesarean delivery delivered  Additional problems: A2GDM, Obesity, Anemia     Discharge diagnosis: Term Pregnancy Delivered and GDM A2                                                                                                Post partum procedures:none.  Augmentation:  None  Complications: None  Hospital course:  Sceduled C/S   33y.o. yo [id number] admitted to the hospital 04/08/2019 for scheduled cesarean section with the following indication:Active HSV.  Membrane Rupture Time/Date: 1:54 PM ,04/08/2019   Patient delivered a Viable infant.04/08/2019  Details of operation can be found in separate operative note.  Pateint had an uncomplicated postpartum course.  She is ambulating, tolerating a regular diet, passing flatus, and urinating well. Patient is discharged home in stable condition on  04/11/19. Pt denies issues with ambulation and states she has been walking multiple laps around the unit each day for exercise.        Delivery time: 1:55 PM   Magnesium Sulfate received: No BMZ received: No Rhophylac:N/A MMR:No - patient refused Transfusion:No  Physical exam  Vitals:   04/10/19 0510 04/10/19 1420 04/10/19 2134 04/11/19 0536  BP: 97/68 (!) 142/82 (!) 149/82 132/69  Pulse: 72 83 77 70  Resp: '18 18 20 20  ' Temp: 97.6 F (36.4 C)  98 F (36.7 C) 98.5 F (36.9 C)  TempSrc: Oral  Oral   SpO2:    99%  Weight:      Height:       General: alert, cooperative and no distress Lochia: appropriate Uterine Fundus: firm Incision:  Healing well with no significant drainage, No significant erythema, Dressing is clean, dry, and intact DVT Evaluation: No evidence of DVT seen on physical exam. No cords or calf tenderness. No significant calf/ankle edema. Labs: Lab Results  Component Value Date   WBC 10.8 (H) 04/10/2019   HGB 9.1 (L) 04/10/2019   HCT 29.2 (L) 04/10/2019   MCV 69.2 (L) 04/10/2019   PLT 263 04/10/2019   CMP Latest Ref Rng & Units 04/08/2019  Glucose 70 - 99 mg/dL -  BUN 6 - 20 mg/dL -  Creatinine 0.44 - 1.00 mg/dL 0.79  Sodium 135 - 145 mmol/L -  Potassium 3.5 - 5.1 mmol/L -  Chloride 98 - 111 mmol/L -  CO2 22 - 32 mmol/L -  Calcium 8.9 - 10.3 mg/dL -  Total Protein 6.5 - 8.1 g/dL -  Total Bilirubin 0.3 - 1.2 mg/dL -  Alkaline Phos 38 - 126 U/L -  AST 15 - 41 U/L -  ALT 0 - 44 U/L -    Discharge instruction: per After Visit Summary and "Baby and Me Booklet".  After visit meds:  Allergies as of 04/11/2019   No Known Allergies     Medication List    STOP taking these medications   B-D UF III MINI PEN NEEDLES 31G X 5 MM Misc Generic drug: Insulin Pen Needle   Levemir FlexTouch 100 UNIT/ML Pen Generic drug: Insulin Detemir   NovoLOG FlexPen 100 UNIT/ML FlexPen Generic drug: insulin aspart   terconazole 0.8 % vaginal cream Commonly known as: TERAZOL 3     TAKE these medications   Accu-Chek FastClix Lancets Misc TEST QID   Accu-Chek Guide test strip Generic drug: glucose blood TEST QID   Accu-Chek Guide w/Device Kit 1 Device by Does not apply route daily. Use as directed   albuterol 108 (90 Base) MCG/ACT inhaler Commonly known as: VENTOLIN HFA Inhale 2 puffs into the lungs every 4 (four) hours as needed for wheezing or shortness of breath.   APAP 325 MG tablet Take 2 tablets (650 mg total) by mouth every 6 (six) hours as needed.   Blood Pressure Kit Devi 1 Device by Does not apply route as needed.   buPROPion 150 MG 24 hr tablet Commonly known as: WELLBUTRIN XL Take  1 tablet (150 mg total) by mouth daily.   ferrous sulfate 325 (65 FE) MG tablet Take 1 tablet (325 mg total) by mouth daily with breakfast.   ibuprofen 800 MG tablet Commonly known as: ADVIL Take 1 tablet (800 mg total) by mouth every 6 (six) hours.   oxyCODONE 5 MG immediate release tablet Commonly known as: Oxy IR/ROXICODONE Take 1-2 tablets (5-10 mg total) by mouth every 4 (four) hours as needed for moderate pain.   Prenatal 27-1 MG Tabs Take 1 tablet by mouth daily.   senna 8.6 MG Tabs tablet Commonly known as: SENOKOT Take 1 tablet (8.6 mg total) by mouth daily as needed.   sertraline 100 MG tablet Commonly known as: ZOLOFT Take 1 tablet (100 mg total) by mouth daily. What changed: how much to take   valACYclovir 500 MG tablet Commonly known as: VALTREX Take 1 tablet (500 mg total) by mouth 2 (two) times daily. What changed:   when to take this  reasons to take this       Diet: routine diet  Activity: Advance as tolerated. Pelvic rest for 6 weeks.   Outpatient follow up:2 weeks Follow up Appt: Future Appointments  Date Time Provider [place]  04/26/2019  9:00 AM [place] [facility]  05/05/2019 10:15 AM Rasch, Artist Pais, NP [facility] [place]  05/20/2019  8:20 AM [facility]   Follow up Visit: [place] [facility]. Schedule an appointment as soon as possible for a visit in 2 day(s).   Specialty: Obstetrics and Gynecology Why: BP check 2 days Postpartum visit/GDM testing 4-6 weeks Contact information: [address] [place], [place] [id number] [place] [id number] [phone]         Please schedule this patient for Postpartum visit in: 4 weeks with the following provider: Any provider For C/S patients schedule nurse incision check in weeks 2 weeks: yes High risk pregnancy complicated by: GDMA2 (insulin), obesity, anemia Delivery mode:   CS Anticipated Birth Control: Mirena IUD PP Procedures needed: Incision check, 2 hour GTT Schedule Integrated BH visit: no  Per consultation with Dr. Kennon Rounds regarding patient's labile BP, message sent to ELAM with high importance to schedule patient for BP check in 2days.  Newborn Data: Live born female  Birth Weight:  3575g APGAR: 8, 9  Newborn Delivery   Birth date/time: 04/08/2019 13:55:00 Delivery type: C-Section, Low Transverse Trial of labor: No C-section categorization: Primary      Baby Feeding: Bottle and Breast Disposition:NICU for jaundice, 1 night anticipated   04/11/2019 Clarisa Fling, NP

## 2019-04-08 NOTE — Anesthesia Preprocedure Evaluation (Addendum)
Anesthesia Evaluation  Patient identified by MRN, date of birth, ID band Patient awake    Reviewed: Allergy & Precautions, H&P , NPO status , Patient's Chart, lab work & pertinent test results  History of Anesthesia Complications Negative for: history of anesthetic complications  Airway Mallampati: III  TM Distance: >3 FB Neck ROM: full    Dental no notable dental hx.    Pulmonary neg pulmonary ROS, former smoker,    Pulmonary exam normal        Cardiovascular negative cardio ROS Normal cardiovascular exam     Neuro/Psych PSYCHIATRIC DISORDERS Anxiety Depression negative neurological ROS     GI/Hepatic negative GI ROS, Neg liver ROS,   Endo/Other  diabetes, Gestational, Insulin DependentMorbid obesity  Renal/GU negative Renal ROS  negative genitourinary   Musculoskeletal   Abdominal   Peds  Hematology  (+) REFUSES BLOOD PRODUCTS, JEHOVAH'S WITNESS  Anesthesia Other Findings   Reproductive/Obstetrics (+) Pregnancy                            Anesthesia Physical Anesthesia Plan  ASA: III  Anesthesia Plan: Spinal   Post-op Pain Management:    Induction:   PONV Risk Score and Plan: Ondansetron and Treatment may vary due to age or medical condition  Airway Management Planned:   Additional Equipment:   Intra-op Plan:   Post-operative Plan:   Informed Consent: I have reviewed the patients History and Physical, chart, labs and discussed the procedure including the risks, benefits and alternatives for the proposed anesthesia with the patient or authorized representative who has indicated his/her understanding and acceptance.       Plan Discussed with:   Anesthesia Plan Comments: (Patient refuses red blood cells and has signed blood refusal consent. I discussed the risks of blood refusal extensively, and patient refuses blood transfusion under any circumstances, and specifically  accepts the risks of death, coma, stroke, permanent brain damage or other end-organ damage due to acute blood loss. This was discussed with no family members present. Patient does accept plasma and albumin.)       Anesthesia Quick Evaluation

## 2019-04-09 LAB — CBC
HCT: 32.8 % — ABNORMAL LOW (ref 36.0–46.0)
Hemoglobin: 10 g/dL — ABNORMAL LOW (ref 12.0–15.0)
MCH: 21.4 pg — ABNORMAL LOW (ref 26.0–34.0)
MCHC: 30.5 g/dL (ref 30.0–36.0)
MCV: 70.1 fL — ABNORMAL LOW (ref 80.0–100.0)
Platelets: 254 10*3/uL (ref 150–400)
RBC: 4.68 MIL/uL (ref 3.87–5.11)
RDW: 16.5 % — ABNORMAL HIGH (ref 11.5–15.5)
WBC: 9.8 10*3/uL (ref 4.0–10.5)
nRBC: 0 % (ref 0.0–0.2)

## 2019-04-09 MED ORDER — IBUPROFEN 800 MG PO TABS
800.0000 mg | ORAL_TABLET | Freq: Four times a day (QID) | ORAL | Status: DC
  Administered 2019-04-09 – 2019-04-11 (×9): 800 mg via ORAL
  Filled 2019-04-09 (×8): qty 1

## 2019-04-09 NOTE — Clinical Social Work Maternal (Signed)
CLINICAL SOCIAL WORK MATERNAL/CHILD NOTE  Patient Details  Name: Suzanne Bonilla MRN: [id number] Date of Birth: 11/07/1985  Date:  04/09/2019  Clinical Social Worker Initiating Note:  Ronak Duquette, LCSW Date/Time: Initiated:  04/09/19/0915     Child's Name:  Suzanne Bonilla   Biological Parents:  Mother   Need for Interpreter:  None   Reason for Referral:  Behavioral Health Concerns, Current Substance Use/Substance Use During Pregnancy    Address:  [address] [place] [address]    Phone number:  [phone] (home)     Additional phone number: none   Household Members/Support Persons (HM/SP):   Household Member/Support Person 2, Household Member/Support Person 1   HM/SP Name Relationship DOB or Age  HM/SP -1 Erilyn Storr MOB  33  HM/SP -2 Zaelyn Musson  son   09/23/2017  HM/SP -3   Zanayiah Mari  daughter   12/20/2008  HM/SP -4   Aaleeyah Fuller-Perin  daughter   10/24/2014  HM/SP -5        HM/SP -6        HM/SP -7        HM/SP -8          Natural Supports (not living in the home):  Parent, Immediate Family   Professional Supports: None   Employment: Unemployed   Type of Work: none   Education:  Some College   Homebound arranged:  n/a  Financial Resources:  Medicaid   Other Resources:  Food Stamps , WIC   Cultural/Religious Considerations Which May Impact Care:  none reported.   Strengths:  Ability to meet basic needs , Compliance with medical plan , Home prepared for child , Psychotropic Medications, Pediatrician chosen   Psychotropic Medications:  Zoloft, Wellbutrin      Pediatrician:    [place] area  Pediatrician List:   [facility]    [place] [facility]  [place]    [place]    [place]    [place]      Pediatrician Fax Number:    Risk Factors/Current Problems:  Mental Health Concerns    Cognitive State:  Able to Concentrate (MOB would be alert and then fall off to  sleep.)   Mood/Affect:  Calm , Comfortable , Relaxed , Flat    CSW Assessment: CSW consulted as MOB has a mental history of depression, anxiety, PTSD. MOB also had THC use during pregnancy. CSW went to speak with MOB at bedside to address further needs.  CSW congratulated MOB on the birth of infant. CSW asked if it was okay for CSW to come in and speak with MOB, and MOB reported that it was okay. CSW advised MOB of CSW's role and the reason for CSW coming to visit with her. MOB reported that she was she was diagnosed with depression and anxiety as a teenager. MOB went on to tell CSW that she as well as her other siblings were placed into foster care as a result of her mother being placed into the hospital "and my grandmother who was her primary care taker had to go to work and had no one to watch us so she dropped us off at social services". MOB went on to tell CSW that she wished that she would have just been able to stay with her mom"becasue that is when a lot of bad stuff started happening to me". CSW asked MOB what she meant by "bad stuff" and MOB suggested that she didn't   want to talk about it. CSW advised MOB that if something happened to her-CSW wanted to be sure that MOB was okay. MOB reported that being on Zoloft and Wellbutrin have been helping her cope with things. CSW understanding and proceeded with assessing MOB. MOB went on to tell CSW that her mom has an extensive history of mental health as well, but her mom is her primary support. CSW commended MOB on having a positive support as MOB reported that FOB didn't want to be involved in helping with infant as "he doesn't want to be involved because he hates his name- but that's okay imma put him on child support and he can handle it from there". CSW understanding and asked MOB how she felt since Fob wasn't wanting to be involved. MOB reported feeling fine "im a nonchalant person and that makes him mad-so he thinks he can use the baby as a way  to get back at me". MOB expressed that she has three other children all whom she is caring for alone. CSW commended MOB on this and asked MOB if she had all items to care for this infant. MOB reported that she has all items at this time. CSW assessed MOB's current mental health as MOB kept falling asleep while speaking to CSW and would often seem disengaged. MOB reported "im fine. im just tired that is why I keep dozing goff". CSW offered to stop assessment and return later, however MOB reported that she would stay up and answer questions. CSW proceeded once more with assessing MOB. CSW inquired from [facility] on if she has ever been in therapy for mental health and MOB reported that she I snot in therapy and is interested in having therapy resources. CSW left therapy information in room for MOB. MOB currently denied feelings SI or HI. When asked about DV MOB reported "nah Im not, that's why im not with his father- Im not nieve". CSW asked MOB if she has ever been in a DV situation. however MOB never answered questions due to falling asleep or switching to another thought. CSW was made aware by MOB that she has been feeling "no feelings". CSW asked that MOB keep track of how she is feeling so that she doesn't develop PPD, as MOB reported that she had PPD with all of her other children and that is when the Wellburtin was started for her.   CSW inquired from [facility] on her THC use. MOB reported that she has never used THC while pregnant and suggested that if she was positive in July then that was from "second hand smoke". MOB reported that she was at a gathering and the people there were smoking. "I was at their house so I couldn't asked them to stop smoking-its their house". CSW understanding but also advised MOB of the hospital drug screen policy. In explaining drug screen policy MOB stopped CSW and said "I amy have had an edible that my cousin made but then again im not sure if anything was in them". CSW validated this but  continued to explain to [facility] hospital drug screen policy. MOB was advised that if infants CDS returns positive for any substance that MOB was not given by MD or while here, then a CPS report would need to be made. MOB reported that she understood and had no other questions regarding policy. CSW asked MOB if she has ever had any CPS history and MOB reported that she did with the birth of her last child. MOB  reported that the report was for [facility] as well. MOB expressed that case was closed "beacsue I had an attitude is why I think it was closed". At this moment, MOB reported to CSW that she "dislikes CPS as they are the reason I went through what I went through". CSW validated this concern for MOB and reported that usually CPS is there to protect children.  MOB reported "well they didn't do that for me". CSW allowed MOB to vent about CPS experiences from her childhood. CSW apologized to [facility] for her  having to deal with so much pain growing up and MOB reported "this is why I do what. I cant leave my kids. I wouldn't have went through all of this to kill myself or my baby". CSW commended MOB on the desire to be a great mother. MOB began to show CSW pictures of her other children and this appeared to be MOB so much joy as her face lit up and her words expressed love for her children.   MOB was given PPD and SIDS education. MOB was give PPD Checklist in order to keep track of her feelings as they relate to PPD. MOB reported that she had no other questions. CSW questioned MOB on where infant would sleep once arrived home as infant needs safe sleep space. MOB reported "in the bed with me, no im kidding, well it will be in the bed but I have a co-sleeper for him". CSW assured MOB that infant would need a safe sleeping space to reduce the possibility of SIDS. MOB reported understanding of this.  CSW will continue to monitor infants CDS for CPS report if warranted.   CSW Plan/Description:  No Further Intervention  Required/No Barriers to Discharge, Perinatal Mood and Anxiety Disorder (PMADs) Education, Sudden Infant Death Syndrome (SIDS) Education, Hospital Drug Screen Policy Information, CSW Will Continue to Monitor Umbilical Cord Tissue Drug Screen Results and Make Report if Celso Sickle, LCSWA 2018/09/23, 10:52 AM

## 2019-04-09 NOTE — Lactation Note (Signed)
This note was copied from a baby's chart. Lactation Consultation Note  Patient Name: Suzanne Bonilla IPJAS'N Date: 04/09/2019 Reason for consult: Follow-up assessment;Early term 37-38.6wks Type of Endocrine Disorder?: Diabetes  P4 mother whose infant is now 42 hours old.  Mother did not breast feed her first two children but breast fed her last child (now 31 year old) for 6-7 months.  Her feeding preference on admission was breast/bottle.  Mother was feeding baby in the cradle hold when I arrived.  He was not latched well and was restless.  Offered to assist with latching and mother accepted.  Suggested she try the cross cradle hold or the football hold.  Mother willing to try the cross cradle hold.  Mother expressed colostrum drops and I demonstrated finger feeding.  Mother had baby dressed and swaddled.  Discussed the importance of feeding STS, however, mother desires to keep his onesie on.  She removed his fleece swaddle.  Assisted baby to latch on the left breast in the cross cradle hold.  He began sucking but required gentle stimulation to continue.  Mother afraid that he "could not breathe."  Discussed how to obtain and maintain a good deep latch.  Allowed mother time for questions and to observe how comfortable her baby looked with this latch.  She needed reminders to keep her breast compressions away from his nose.  Mother and baby became somewhat restless and baby lost his latch.  Mother asked if she could feed on the right breast.  Again, reminded her about STS but she wanted to keep his onesie on.  I awakened him and showed gentle stimulation techniques.  Assisted to latch onto the right breast easily.  He remained sleepy but I encouraged mother to continue gentle stimulation.  Observed him feeding on/off for 10 minutes.    Mother is interested in learning the football hold and I suggested she call her RN/LC at the next feeding and we would be happy to assist.  Suggested she practice hand  expression before/after feedings to help increase milk supply.    Mother does not have a DEBP for home use.  She plans to call [facility] on Monday morning but she also wants to obtain the formula package.  I informed her that she will not be eligible to obtain a DEBP if she requests the formula package.  Mother understanding.  RN updated.   Maternal Data Formula Feeding for Exclusion: Yes Reason for exclusion: Mother's choice to formula and breast feed on admission Has patient been taught Hand Expression?: Yes Does the patient have breastfeeding experience prior to this delivery?: Yes  Feeding Feeding Type: Breast Fed  LATCH Score Latch: Repeated attempts needed to sustain latch, nipple held in mouth throughout feeding, stimulation needed to elicit sucking reflex.  Audible Swallowing: None  Type of Nipple: Everted at rest and after stimulation  Comfort (Breast/Nipple): Soft / non-tender  Hold (Positioning): Assistance needed to correctly position infant at breast and maintain latch.  LATCH Score: 6  Interventions Interventions: Breast feeding basics reviewed;Assisted with latch;Skin to skin;Breast massage;Hand express;Breast compression;Adjust position;Position options;Support pillows  Lactation Tools Discussed/Used WIC Program: Yes   Consult Status Consult Status: Follow-up Date: 04/10/19 Follow-up type: In-patient    Abir Eroh R Verdis Bassette 04/09/2019, 1:01 PM

## 2019-04-09 NOTE — Progress Notes (Signed)
Post Partum Day 1 Subjective: no complaints, up ad lib, voiding, tolerating PO, + flatus and pt planning outpatient circumcision for baby. Pt desire Nexplanon for contraception.  Objective: Blood pressure 121/71, pulse 68, temperature 98.4 F (36.9 C), temperature source Oral, resp. rate 17, height 5' 10.5" (1.791 m), weight (!) 156 kg, SpO2 98 %, unknown if currently breastfeeding.  Patient Vitals for the past 24 hrs:  BP Temp Temp src Pulse Resp SpO2  04/09/19 0735 121/71 98.4 F (36.9 C) Oral 68 17 98 %  04/09/19 0316 109/69 99 F (37.2 C) Oral 63 18 --  04/09/19 0002 (!) 99/50 -- -- 100 18 --  04/09/19 0000 113/66 -- -- (!) 58 18 --  04/08/19 2352 (!) 113/59 98.2 F (36.8 C) Oral 69 18 99 %  04/08/19 2146 -- -- -- -- -- 98 %  04/08/19 1959 113/64 97.6 F (36.4 C) Oral 79 18 96 %  04/08/19 1734 123/63 97.6 F (36.4 C) Axillary 77 20 99 %  04/08/19 1617 115/69 98 F (36.7 C) Oral 76 18 100 %  04/08/19 1600 115/73 -- -- 86 19 98 %  04/08/19 1545 96/85 97.8 F (36.6 C) Oral 78 16 100 %  04/08/19 1530 122/67 -- -- 75 15 98 %  04/08/19 1515 126/68 -- -- 72 18 100 %  04/08/19 1502 118/61 97.6 F (36.4 C) Oral 85 (!) 21 97 %   Physical Exam:  General: alert, cooperative and no distress Lochia: appropriate Uterine Fundus: firm Incision: healing well, no significant drainage, no significant erythema DVT Evaluation: No evidence of DVT seen on physical exam. Negative Homan's sign. No cords or calf tenderness. No significant calf/ankle edema.  Recent Labs    04/08/19 1719 04/09/19 0451  HGB 10.4* 10.0*  HCT 32.5* 32.8*    Assessment/Plan: Plan for discharge tomorrow, Breastfeeding and Contraception Nexplanon in hospital.  Abdominal binder ordered for patient comfort with coughing and sneezing.   LOS: 1 day   Gerrie Nordmann Marris Frontera 04/09/2019, 12:24 PM

## 2019-04-09 NOTE — Plan of Care (Signed)
  Problem: Education: Goal: Knowledge of General Education information will improve Description: Including pain rating scale, medication(s)/side effects and non-pharmacologic comfort measures 04/09/2019 0443 by Yancey Flemings, RN Outcome: Adequate for Discharge 04/09/2019 0443 by Yancey Flemings, RN Outcome: Adequate for Discharge

## 2019-04-09 NOTE — Plan of Care (Signed)
  Problem: Education: Goal: Knowledge of General Education information will improve Description: Including pain rating scale, medication(s)/side effects and non-pharmacologic comfort measures 04/09/2019 0443 by Yancey Flemings, RN Outcome: Adequate for Discharge 04/09/2019 0443 by Yancey Flemings, RN Outcome: Adequate for Discharge   Problem: Clinical Measurements: Goal: Ability to maintain clinical measurements within normal limits will improve 04/09/2019 0443 by Yancey Flemings, RN Outcome: Adequate for Discharge 04/09/2019 0443 by Yancey Flemings, RN Outcome: Adequate for Discharge Goal: Respiratory complications will improve Outcome: Adequate for Discharge Goal: Cardiovascular complication will be avoided Outcome: Adequate for Discharge   Problem: Nutrition: Goal: Adequate nutrition will be maintained Outcome: Adequate for Discharge

## 2019-04-09 NOTE — Plan of Care (Signed)
  Problem: Education: Goal: Knowledge of General Education information will improve Description: Including pain rating scale, medication(s)/side effects and non-pharmacologic comfort measures 04/09/2019 0443 by Bayla Mcgovern S, RN Outcome: Adequate for Discharge 04/09/2019 0443 by Avonell Lenig S, RN Outcome: Adequate for Discharge   

## 2019-04-10 LAB — CBC
HCT: 29.2 % — ABNORMAL LOW (ref 36.0–46.0)
Hemoglobin: 9.1 g/dL — ABNORMAL LOW (ref 12.0–15.0)
MCH: 21.6 pg — ABNORMAL LOW (ref 26.0–34.0)
MCHC: 31.2 g/dL (ref 30.0–36.0)
MCV: 69.2 fL — ABNORMAL LOW (ref 80.0–100.0)
Platelets: 263 10*3/uL (ref 150–400)
RBC: 4.22 MIL/uL (ref 3.87–5.11)
RDW: 16.6 % — ABNORMAL HIGH (ref 11.5–15.5)
WBC: 10.8 10*3/uL — ABNORMAL HIGH (ref 4.0–10.5)
nRBC: 0 % (ref 0.0–0.2)

## 2019-04-10 NOTE — Progress Notes (Addendum)
The Rn discussed at shift change and the night before with the  patient how important ambulation was in decreasing pain. The Patient will just ambulate to the restroom. The Patient does not ambulate in the room. The Patient stays in the bed. The patient will not ambulate in the hallways.   The patient;s  pain level is not controlled due to lack of ambulation.

## 2019-04-10 NOTE — Plan of Care (Signed)
  Problem: Education: Goal: Knowledge of General Education information will improve Description: Including pain rating scale, medication(s)/side effects and non-pharmacologic comfort measures Outcome: Completed/Met   Problem: Clinical Measurements: Goal: Diagnostic test results will improve Outcome: Completed/Met   Problem: Coping: Goal: Level of anxiety will decrease Outcome: Completed/Met   Problem: Elimination: Goal: Will not experience complications related to bowel motility Outcome: Completed/Met   Problem: Safety: Goal: Ability to remain free from injury will improve Outcome: Completed/Met

## 2019-04-10 NOTE — Progress Notes (Signed)
CSW acknowledges consult for transportation assistance and spoke with bedside nurse regarding transportation options for family.  MOB has a reliable car that is currently here at the hospital. Bedside nurse has processed options with MOB and MOB is not willing to leave her car here a the hospital or have someone to pick her up. CSW has offered to provide a taxi voucher to [facility]; bedside nurse will follow-up with CSW if MOB's is interested in taxi voucher.   Laurey Arrow, MSW, LCSW Clinical Social Work [phone]

## 2019-04-10 NOTE — Lactation Note (Signed)
This note was copied from a baby's chart. Lactation Consultation Note  Patient Name: Suzanne Bonilla YIFOY'D Date: 04/10/2019 Reason for consult: Follow-up assessment Type of Endocrine Disorder?: Diabetes(GDM)  LC completed a follow-up visit with mom and baby Jayceon "[name]". Mom shared that she was beginning to feel nipple soreness and severe back pain. LC reviewed feeding cues and normal, infant feeding patterns. LC did not note any nipple trauma.  Following a diaper change, [name] was exhibiting feeding cues. LC offered to assist mom with a feed and mom was agreeable. Mom hand expressed first and colostrum was noted. Mom latched [name] to the right breast in cradle hold with minimal assistance, LC adjusted position to help baby be belly to belly. LC heard audible swallows and mom reported feeling uterine contractions. LC showed mom how to check if [name] had a proper latch/flanging by using the chin tug.   Mom asked if her recent dose of pain meds would affect her breastmilk. Rivanna shared that the  small dose she received would not hinder a healthy, breastfeeding relationship. Mom requested breast pads and a comfort gels, which the LC brought to the room. Mom latched [name] to her left breast and was still feeding when [place] left the room.   LC encouraged mom to call Lactation should she need assistance. Mom shared that at this time, she had no further questions or concerns but will call if needed.   Maternal Data Has patient been taught Hand Expression?: Yes Does the patient have breastfeeding experience prior to this delivery?: Yes  Feeding Feeding Type: Breast Fed  LATCH Score Latch: Grasps breast easily, tongue down, lips flanged, rhythmical sucking.  Audible Swallowing: Spontaneous and intermittent  Type of Nipple: Everted at rest and after stimulation  Comfort (Breast/Nipple): Soft / non-tender  Hold (Positioning): Assistance needed to correctly position infant at breast and  maintain latch.  LATCH Score: 9  Interventions Interventions: Skin to skin;Assisted with latch;Comfort gels  Lactation Tools Discussed/Used WIC Program: Yes   Consult Status Consult Status: Follow-up Date: 04/11/19 Follow-up type: In-patient    Lenore Manner 04/10/2019, 5:34 PM

## 2019-04-10 NOTE — Progress Notes (Signed)
Patient ID: Suzanne Bonilla, female   DOB: 1986/04/01, 33 y.o.   MRN: [id number]   POSTPARTUM PROGRESS NOTE  Post Partum Day 2  Subjective:  Suzanne Bonilla is a 33 y.o. [id number] s/p pLTCS at [redacted]w[redacted]d.  No acute events overnight.  Pt reports problems with ambulating, voiding or po intake.  She denies nausea or vomiting.  Pain is poorly controlled.  She has had flatus. She has not had bowel movement.  Lochia Minimal.   Objective: Blood pressure 97/68, pulse 72, temperature 97.6 F (36.4 C), temperature source Oral, resp. rate 18, height 5' 10.5" (1.791 m), weight (!) 156 kg, SpO2 98 %, unknown if currently breastfeeding.  Physical Exam:  General: alert, cooperative and no distress Chest: no respiratory distress Heart:regular rate, distal pulses intact Abdomen: soft, nontender,  Uterine Fundus: firm, appropriately tender DVT Evaluation: No calf swelling or tenderness Extremities: minimal/mild edema Skin: warm, dry, dressing clean, dry, intact. Prevena intact.   Recent Labs    04/08/19 1719 04/09/19 0451  HGB 10.4* 10.0*  HCT 32.5* 32.8*    Assessment/Plan: Suzanne Bonilla is a 33 y.o. [id number] s/p pLTCS d/t active HSV outbreak at [redacted]w[redacted]d   PPD#2- Doing well Contraception: ? Nexplanon  Feeding: breast/bottle  Dispo: Plan for discharge tomorrow. Patient has not been out of bed. She is not moving around per the RN. Patient also having transportation issues and plans to drive herself home. Social work consult.    LOS: 2 days   Arlow Spiers, Artist Pais, NP, CNM 04/10/2019, 12:00 PM

## 2019-04-11 ENCOUNTER — Encounter (HOSPITAL_COMMUNITY): Payer: Self-pay | Admitting: Obstetrics and Gynecology

## 2019-04-11 MED ORDER — IBUPROFEN 800 MG PO TABS: 800.0000 mg | ORAL_TABLET | Freq: Four times a day (QID) | ORAL | 0 refills | Status: AC

## 2019-04-11 MED ORDER — SENNA 8.6 MG PO TABS: 1.0000 | ORAL_TABLET | Freq: Every day | ORAL | 0 refills | Status: AC | PRN

## 2019-04-11 MED ORDER — FERROUS SULFATE 325 (65 FE) MG PO TABS: 325.0000 mg | ORAL_TABLET | Freq: Every day | ORAL | 0 refills | Status: AC

## 2019-04-11 MED ORDER — APAP 325 MG PO TABS: 650.0000 mg | ORAL_TABLET | Freq: Four times a day (QID) | ORAL | 0 refills | Status: AC | PRN

## 2019-04-11 MED ORDER — MEASLES, MUMPS & RUBELLA VAC IJ SOLR: 0.5000 mL | Freq: Once | INTRAMUSCULAR | Status: DC

## 2019-04-11 MED ORDER — OXYCODONE HCL 5 MG PO TABS: 5.0000 mg | ORAL_TABLET | ORAL | 0 refills | Status: AC | PRN

## 2019-04-11 NOTE — Discharge Instructions (Signed)
No lifting greater than 10 pounds, no driving for 4 weeks.  Postpartum Care After Cesarean Delivery This sheet gives you information about how to care for yourself from the time you deliver your baby to up to 6-12 weeks after delivery (postpartum period). Your health care provider may also give you more specific instructions. If you have problems or questions, contact your health care provider. Follow these instructions at home: Medicines  Take over-the-counter and prescription medicines only as told by your health care provider.  If you were prescribed an antibiotic medicine, take it as told by your health care provider. Do not stop taking the antibiotic even if you start to feel better.  Ask your health care provider if the medicine prescribed to you: ? Requires you to avoid driving or using heavy machinery. ? Can cause constipation. You may need to take actions to prevent or treat constipation, such as:  Drink enough fluid to keep your urine pale yellow.  Take over-the-counter or prescription medicines.  Eat foods that are high in fiber, such as beans, whole grains, and fresh fruits and vegetables.  Limit foods that are high in fat and processed sugars, such as fried or sweet foods. Activity  Gradually return to your normal activities as told by your health care provider.  Avoid activities that take a lot of effort and energy (are strenuous) until approved by your health care provider. Walking at a slow to moderate pace is usually safe. Ask your health care provider what activities are safe for you. ? Do not lift anything that is heavier than your baby or 10 lb (4.5 kg) as told by your health care provider. ? Do not vacuum, climb stairs, or drive a car for as long as told by your health care provider.  If possible, have someone help you at home until you are able to do your usual activities yourself.  Rest as much as possible. Try to rest or take naps while your baby is  sleeping. Vaginal bleeding  It is normal to have vaginal bleeding (lochia) after delivery. Wear a sanitary pad to absorb vaginal bleeding and discharge. ? During the first week after delivery, the amount and appearance of lochia is often similar to a menstrual period. ? Over the next few weeks, it will gradually decrease to a dry, yellow-brown discharge. ? For most women, lochia stops completely by 4-6 weeks after delivery. Vaginal bleeding can vary from woman to woman.  Change your sanitary pads frequently. Watch for any changes in your flow, such as: ? A sudden increase in volume. ? A change in color. ? Large blood clots.  If you pass a blood clot, save it and call your health care provider to discuss. Do not flush blood clots down the toilet before you get instructions from your health care provider.  Do not use tampons or douches until your health care provider says this is safe.  If you are not breastfeeding, your period should return 6-8 weeks after delivery. If you are breastfeeding, your period may return anytime between 8 weeks after delivery and the time that you stop breastfeeding. Perineal care   If your C-section (Cesarean section) was unplanned, and you were allowed to labor and push before delivery, you may have pain, swelling, and discomfort of the tissue between your vaginal opening and your anus (perineum). You may also have an incision in the tissue (episiotomy) or the tissue may have torn during delivery. Follow these instructions as told by your health  care provider: ? Keep your perineum clean and dry as told by your health care provider. Use medicated pads and pain-relieving sprays and creams as directed. ? If you have an episiotomy or vaginal tear, check the area every day for signs of infection. Check for:  Redness, swelling, or pain.  Fluid or blood.  Warmth.  Pus or a bad smell. ? You may be given a squirt bottle to use instead of wiping to clean the perineum  area after you go to the bathroom. As you start healing, you may use the squirt bottle before wiping yourself. Make sure to wipe gently. ? To relieve pain caused by an episiotomy, vaginal tear, or hemorrhoids, try taking a warm sitz bath 2-3 times a day. A sitz bath is a warm water bath that is taken while you are sitting down. The water should only come up to your hips and should cover your buttocks. Breast care  Within the first few days after delivery, your breasts may feel heavy, full, and uncomfortable (breast engorgement). You may also have milk leaking from your breasts. Your health care provider can suggest ways to help relieve breast discomfort. Breast engorgement should go away within a few days.  If you are breastfeeding: ? Wear a bra that supports your breasts and fits you well. ? Keep your nipples clean and dry. Apply creams and ointments as told by your health care provider. ? You may need to use breast pads to absorb milk leakage. ? You may have uterine contractions every time you breastfeed for several weeks after delivery. Uterine contractions help your uterus return to its normal size. ? If you have any problems with breastfeeding, work with your health care provider or a Advertising copywriter.  If you are not breastfeeding: ? Avoid touching your breasts as this can make your breasts produce more milk. ? Wear a well-fitting bra and use cold packs to help with swelling. ? Do not squeeze out (express) milk. This causes you to make more milk. Intimacy and sexuality  Ask your health care provider when you can engage in sexual activity. This may depend on your: ? Risk of infection. ? Healing rate. ? Comfort and desire to engage in sexual activity.  You are able to get pregnant after delivery, even if you have not had your period. If desired, talk with your health care provider about methods of family planning or birth control (contraception). Lifestyle  Do not use any products  that contain nicotine or tobacco, such as cigarettes, e-cigarettes, and chewing tobacco. If you need help quitting, ask your health care provider.  Do not drink alcohol, especially if you are breastfeeding. Eating and drinking   Drink enough fluid to keep your urine pale yellow.  Eat high-fiber foods every day. These may help prevent or relieve constipation. High-fiber foods include: ? Whole grain cereals and breads. ? Brown rice. ? Beans. ? Fresh fruits and vegetables.  Take your prenatal vitamins until your postpartum checkup or until your health care provider tells you it is okay to stop. General instructions  Keep all follow-up visits for you and your baby as told by your health care provider. Most women visit their health care provider for a postpartum checkup within the first 3-6 weeks after delivery. Contact a health care provider if you:  Feel unable to cope with the changes that a new baby brings to your life, and these feelings do not go away.  Feel unusually sad or worried.  Have breasts  that are painful, hard, or turn red.  Have a fever.  Have trouble holding urine or keeping urine from leaking.  Have little or no interest in activities you used to enjoy.  Have not breastfed at all and you have not had a menstrual period for 12 weeks after delivery.  Have stopped breastfeeding and you have not had a menstrual period for 12 weeks after you stopped breastfeeding.  Have questions about caring for yourself or your baby.  Pass a blood clot from your vagina. Get help right away if you:  Have chest pain.  Have difficulty breathing.  Have sudden, severe leg pain.  Have severe pain or cramping in your abdomen.  Bleed from your vagina so much that you fill more than one sanitary pad in one hour. Bleeding should not be heavier than your heaviest period.  Develop a severe headache.  Faint.  Have blurred vision or spots in your vision.  Have a bad-smelling  vaginal discharge.  Have thoughts about hurting yourself or your baby. If you ever feel like you may hurt yourself or others, or have thoughts about taking your own life, get help right away. You can go to your nearest emergency department or call:  Your local emergency services (911 in the [place]  A suicide crisis helpline, such as the National Suicide Prevention Lifeline at 720-225-49801-(239)689-1689. This is open 24 hours a day. Summary  The period of time from when you deliver your baby to up to 6-12 weeks after delivery is called the postpartum period.  Gradually return to your normal activities as told by your health care provider.  Keep all follow-up visits for you and your baby as told by your health care provider. This information is not intended to replace advice given to you by your health care provider. Make sure you discuss any questions you have with your health care provider. Document Released: 04/04/2000 Document Revised: 11/25/2017 Document Reviewed: 11/25/2017 Elsevier Patient Education  2020 [employer]

## 2019-04-11 NOTE — Lactation Note (Signed)
This note was copied from a baby's chart. Lactation Consultation Note  Patient Name: Boy Meli Faley [id number] Date: 04/11/2019 Reason for consult: Follow-up assessment Baby is 66 hours old/8% weight loss.  Mom states baby is latching without difficulty.  Mom states she is in a lot of pain and waiting for pain meds.  Discussed milk coming to volume.  Reviewed outpatient services and encouraged to call prn.  Nurse is aware of pain med request.  Maternal Data    Feeding Feeding Type: Breast Fed  LATCH Score                   Interventions    Lactation Tools Discussed/Used     Consult Status Consult Status: Complete Follow-up type: Call as needed    Ave Filter 04/11/2019, 8:38 AM

## 2019-04-11 NOTE — Plan of Care (Signed)
Pts. Condition will continue to improve 

## 2019-04-14 ENCOUNTER — Ambulatory Visit (HOSPITAL_COMMUNITY): Payer: Medicare Other

## 2019-04-18 ENCOUNTER — Other Ambulatory Visit: Payer: Self-pay

## 2019-04-18 ENCOUNTER — Ambulatory Visit (INDEPENDENT_AMBULATORY_CARE_PROVIDER_SITE_OTHER): Payer: Medicare Other

## 2019-04-18 ENCOUNTER — Telehealth: Payer: Self-pay | Admitting: *Deleted

## 2019-04-18 VITALS — BP 129/70 | HR 100 | Wt 328.8 lb

## 2019-04-18 DIAGNOSIS — B369 Superficial mycosis, unspecified: Secondary | ICD-10-CM

## 2019-04-18 DIAGNOSIS — Z5189 Encounter for other specified aftercare: Secondary | ICD-10-CM

## 2019-04-18 MED ORDER — NYSTATIN 100000 UNIT/GM EX POWD: 1.0000 "application " | Freq: Three times a day (TID) | CUTANEOUS | 0 refills | Status: AC

## 2019-04-18 NOTE — Telephone Encounter (Signed)
Suzanne Bonilla called front desk and call transferred to nurse. States her pump for her incision has cut off and she read instructions and understands it has completed its course. Advised her she can remove pump and dressing. [name] states she has no one to help her to do it. Offered her nurse visit appointment today . She states she has to son to appointment in [place]. Will try to get to our office by around 3:40 for nurse visit.  [name],RN

## 2019-04-18 NOTE — Progress Notes (Signed)
Pt here today following c-section on 04/08/19 for wound check. Prevena Incision Management System removed; old drainage to dressing. Incision is clean and dry with warmth and redness to surrounding area. Small open area on right side of incision. Kennon Rounds, MD to bedside to evaluate wound. Silver nitrate applied to open area by provider. Nystatin powder ordered per Kennon Rounds, MD. Pt encouraged to continue good wound care and to monitor for s/s of infection. Pt reporting pain to the area. Explained to pt she can rotate tylenol and ibuprofen as these are the best medications to decrease inflammation. Pt encouraged to call the office with any questions or concerns.   Apolonio Schneiders RN 04/18/19

## 2019-04-18 NOTE — Progress Notes (Signed)
Patient seen and assessed by nursing staff.  Agree with documentation and plan.  

## 2019-04-20 ENCOUNTER — Ambulatory Visit (INDEPENDENT_AMBULATORY_CARE_PROVIDER_SITE_OTHER): Payer: Medicare Other | Admitting: Obstetrics and Gynecology

## 2019-04-20 ENCOUNTER — Encounter: Payer: Self-pay | Admitting: Obstetrics and Gynecology

## 2019-04-20 ENCOUNTER — Telehealth: Payer: Self-pay | Admitting: General Practice

## 2019-04-20 ENCOUNTER — Other Ambulatory Visit: Payer: Self-pay

## 2019-04-20 VITALS — BP 128/78 | HR 91 | Wt 328.3 lb

## 2019-04-20 DIAGNOSIS — L03818 Cellulitis of other sites: Secondary | ICD-10-CM

## 2019-04-20 DIAGNOSIS — B372 Candidiasis of skin and nail: Secondary | ICD-10-CM

## 2019-04-20 DIAGNOSIS — Z8632 Personal history of gestational diabetes: Secondary | ICD-10-CM

## 2019-04-20 MED ORDER — MICONAZOLE NITRATE 2 % EX OINT: 1.0000 | TOPICAL_OINTMENT | Freq: Two times a day (BID) | CUTANEOUS | 1 refills | Status: AC

## 2019-04-20 MED ORDER — CEPHALEXIN 500 MG PO CAPS: 500.0000 mg | ORAL_CAPSULE | Freq: Four times a day (QID) | ORAL | 0 refills | Status: AC

## 2019-04-20 NOTE — Telephone Encounter (Signed)
Patient called into front office reporting concern about possible wound infection. She states she is having an increase in pain since Monday's visit. She reports abdominal pain, lower back pain, & sharp pains in her groin. Patient also reports burning & itching of her incision as well as a mucous discharge & brown discharge from her incision. Patient also reports sharp pains from deep in her vagina whenever she urinates. Patient denies exterior burning with urination or incomplete emptying. Patient reports this all started Monday night into yesterday morning. She has not picked up Nystatin Rx because it wasn't at the pharmacy. Discussed with Dr Ilda Basset who thinks patient should come in for evaluation by a provider. Offered appt today at 2:35 for patient. She verbalized understanding & had no questions.
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
## 2019-04-26 ENCOUNTER — Ambulatory Visit: Payer: Medicare Other

## 2019-05-05 ENCOUNTER — Ambulatory Visit: Payer: Medicare Other | Admitting: Obstetrics and Gynecology

## 2019-05-18 ENCOUNTER — Other Ambulatory Visit: Payer: Self-pay | Admitting: Lactation Services

## 2019-05-18 DIAGNOSIS — Z8632 Personal history of gestational diabetes: Secondary | ICD-10-CM

## 2019-05-20 ENCOUNTER — Ambulatory Visit: Payer: Medicare Other | Admitting: Family Medicine

## 2019-05-20 ENCOUNTER — Other Ambulatory Visit: Payer: Medicare Other

## 2019-06-08 ENCOUNTER — Ambulatory Visit (INDEPENDENT_AMBULATORY_CARE_PROVIDER_SITE_OTHER): Payer: Medicare (Managed Care) | Admitting: Obstetrics and Gynecology

## 2019-06-08 ENCOUNTER — Encounter: Payer: Self-pay | Admitting: Obstetrics and Gynecology

## 2019-06-08 ENCOUNTER — Other Ambulatory Visit: Payer: Medicare Other

## 2019-06-08 NOTE — Progress Notes (Signed)
Patient did not keep her appointment today.  Duane Lope, NP 06/08/2019 9:37 AM

## 2019-07-27 ENCOUNTER — Ambulatory Visit: Payer: Medicare (Managed Care) | Admitting: Obstetrics & Gynecology

## 2019-08-16 ENCOUNTER — Other Ambulatory Visit: Payer: Self-pay

## 2019-08-16 ENCOUNTER — Encounter: Payer: Self-pay | Admitting: *Deleted

## 2019-08-16 ENCOUNTER — Encounter: Payer: Self-pay | Admitting: Family Medicine

## 2019-08-16 ENCOUNTER — Ambulatory Visit (INDEPENDENT_AMBULATORY_CARE_PROVIDER_SITE_OTHER): Payer: Medicare (Managed Care) | Admitting: Family Medicine

## 2019-08-16 VITALS — BP 120/83 | HR 103 | Wt 345.0 lb

## 2019-08-16 DIAGNOSIS — Z30430 Encounter for insertion of intrauterine contraceptive device: Secondary | ICD-10-CM

## 2019-08-16 DIAGNOSIS — Z3202 Encounter for pregnancy test, result negative: Secondary | ICD-10-CM | POA: Diagnosis not present

## 2019-08-16 LAB — POCT PREGNANCY, URINE: Preg Test, Ur: NEGATIVE

## 2019-08-16 MED ORDER — LEVONORGESTREL 19.5 MCG/DAY IU IUD: INTRAUTERINE_SYSTEM | Freq: Once | INTRAUTERINE | Status: AC

## 2019-08-16 NOTE — Progress Notes (Signed)
Wants IUD

## 2019-08-16 NOTE — Progress Notes (Signed)
    GYNECOLOGY OFFICE PROCEDURE NOTE  Suzanne Bonilla is a 34 y.o. [id number] here for Liletta IUD insertion. No GYN concerns. Last pap smear was on 10/11/18 and was normal. Has not had intercourse in a year.  IUD Insertion Procedure Note Patient identified, informed consent performed, consent signed.   Discussed risks of irregular bleeding, cramping, infection, malpositioning or misplacement of the IUD outside the uterus which may require further procedure such as laparoscopy. Time out was performed.  Urine pregnancy test negative.  Speculum placed in the vagina.  Cervix visualized.  Cleaned with Betadine x 2.  Grasped anteriorly with a single tooth tenaculum.  Uterus sounded to 8 cm.  Liletta IUD placed per manufacturer's recommendations.  Strings trimmed to 3 cm. Tenaculum was removed, good hemostasis noted with Monsel's solution.  Patient tolerated procedure well.   Patient was given post-procedure instructions.  She was advised to have backup contraception for one week.  Patient was also asked to check IUD strings periodically and follow up in 4 weeks for IUD check.   Marlowe Alt, DO OB Fellow, Faculty [facility] 08/16/2019 8:37 AM

## 2019-08-16 NOTE — Patient Instructions (Signed)
Intrauterine Device Insertion An intrauterine device (IUD) is a medical device that gets inserted into the uterus to prevent pregnancy. It is a small, T-shaped device that has one or two nylon strings hanging down from it. The strings hang out of the lower part of the uterus (cervix) to allow for future IUD removal. There are two types of IUDs available:  Copper IUD. This type of IUD has copper wire wrapped around it. Copper makes the uterus and fallopian tubes produce a fluid that kills sperm. A copper IUD may last up to 10 years.  Hormone IUD. This type of IUD is made of plastic and contains the hormone progestin (synthetic progesterone). The hormone thickens mucus in the cervix and prevents sperm from entering the uterus. It also thins the uterine lining to prevent implantation of a fertilized egg. The hormone can weaken or kill the sperm that get into the uterus. A hormone IUD may last 3-5 years. Tell a health care provider about:  Any allergies you have.  All medicines you are taking, including vitamins, herbs, eye drops, creams, and over-the-counter medicines.  Any problems you or family members have had with anesthetic medicines.  Any blood disorders you have.  Any surgeries you have had.  Any medical conditions you have, including any STIs (sexually transmitted infections) you may have.  Whether you are pregnant or may be pregnant. What are the risks? Generally, this is a safe procedure. However, problems may occur, including:  Infection.  Bleeding.  Allergic reactions to medicines.  Accidental puncture (perforation) of the uterus, or damage to other structures or organs.  Accidental placement of the IUD either in the muscle layer of the uterus (myometrium) or outside the uterus.  The IUD falling out of the uterus (expulsion). This is more common among women who have recently had a child.  Pregnancy that happens in the fallopian tube (ectopic pregnancy).  Infection of  the uterus and fallopian tubes (pelvic inflammatory disease). What happens before the procedure?  Schedule the IUD insertion for when you will have your menstrual period or right after, to make sure you are not pregnant. Placement of the IUD is better tolerated shortly after a menstrual cycle.  Follow instructions from your health care provider about eating or drinking restrictions.  Ask your health care provider about changing or stopping your regular medicines. This is especially important if you are taking diabetes medicines or blood thinners.  You may get a pain reliever to take before the procedure.  You may have tests for: ? Pregnancy. A pregnancy test involves having a urine sample taken. ? STIs. Placing an IUD in someone who has an STI can make the infection worse. ? Cervical cancer. You may have a Pap test to check for this type of cancer. This means collecting cells from your cervix to be examined under a microscope.  You may have a physical exam to determine the size and position of your uterus. The procedure may vary among health care providers and hospitals. What happens during the procedure?  A tool (speculum) will be placed in your vagina and widened so that your health care provider can see your cervix.  Medicine may be applied to your cervix to help lower your risk of infection (antiseptic medicine).  You may be given an anesthetic medicine to numb each side of your cervix (intracervical block or paracervical block). This medicine is usually given by an injection into the cervix.  A tool (uterine sound) will be inserted into your   uterus to determine the length of your uterus and the direction that your uterus may be tilted.  A slim instrument or tube (IUD inserter) that holds the IUD will be inserted into your vagina, through your cervical canal, and into your uterus.  The IUD will be placed in the uterus, and the IUD inserter will be removed.  The strings that are  attached to the IUD will be trimmed so that they lie just below the cervix. The procedure may vary among health care providers and hospitals. What happens after the procedure?  You may have bleeding after the procedure. This is normal. It varies from light bleeding (spotting) for a few days to menstrual-like bleeding.  You may have cramping and pain.  You may feel dizzy or light-headed.  You may have lower back pain. Summary  An intrauterine device (IUD) is a small, T-shaped device that has one or two nylon strings hanging down from it.  Two types of IUDs are available. You may have a copper IUD or a hormone IUD.  Schedule the IUD insertion for when you will have your menstrual period or right after, to make sure you are not pregnant. Placement of the IUD is better tolerated shortly after a menstrual cycle.  You may have bleeding after the procedure. This is normal. It varies from light spotting for a few days to menstrual-like bleeding. This information is not intended to replace advice given to you by your health care provider. Make sure you discuss any questions you have with your health care provider. Document Revised: 03/20/2017 Document Reviewed: 02/27/2016    IUD PLACEMENT POST-PROCEDURE INSTRUCTIONS  1. You may take Ibuprofen, Aleve or Tylenol for pain if needed.  Cramping should resolve within in 24 hours.  2. You may have a small amount of spotting.  You should wear a mini pad for the next few days.  3. You may have intercourse after 24 hours.  If you using this for birth control, it is effective immediately.  4. You need to call if you have any pelvic pain, fever, heavy bleeding or foul smelling vaginal discharge.  Irregular bleeding is common the first several months after having an IUD placed. You do not need to call for this reason unless you are concerned.  5. Shower or bathe as normal  6. You should have a follow-up appointment in 4-8 weeks for a re-check to make  sure you are not having any problems. [employer] Patient Education  [employer].

## 2019-09-05 ENCOUNTER — Telehealth: Payer: Self-pay | Admitting: *Deleted

## 2019-09-05 NOTE — Telephone Encounter (Addendum)
Pt left VM message stating that she would like to speak with someone regarding the issues she is having with the IUD which was placed one month ago. Please call back.   [phone]  I returned the call to pt and her daughter answered. She said that [facility] was asleep. I asked her to give a message that we would call back tomorrow.

## 2019-09-06 NOTE — Telephone Encounter (Signed)
Fleet Contras took care of this via patient messages.

## 2019-09-12 ENCOUNTER — Encounter: Payer: Self-pay | Admitting: *Deleted

## 2019-10-04 ENCOUNTER — Ambulatory Visit: Payer: Medicare (Managed Care) | Admitting: Family Medicine

## 2019-10-05 ENCOUNTER — Telehealth: Payer: Self-pay | Admitting: Family Medicine

## 2019-10-05 NOTE — Telephone Encounter (Signed)
Patient called in stating that she has been waiting for about a month for a nurse to call her back. She would like to speak to a nurse about the bleeding that she has been experiencing since you got the mirena IUD placed. Patient instructed that the nurses will give her a call as soon as they can. Patient verbalized understanding and message sent to clinical pool.

## 2019-10-05 NOTE — Telephone Encounter (Signed)
Completed spoke to p[atient and got her rescheduled for iud check and bleeding.

## 2019-10-10 ENCOUNTER — Other Ambulatory Visit: Payer: Self-pay

## 2019-10-10 ENCOUNTER — Ambulatory Visit (INDEPENDENT_AMBULATORY_CARE_PROVIDER_SITE_OTHER): Payer: Medicare (Managed Care) | Admitting: Obstetrics & Gynecology

## 2019-10-10 ENCOUNTER — Encounter: Payer: Self-pay | Admitting: Obstetrics & Gynecology

## 2019-10-10 ENCOUNTER — Other Ambulatory Visit: Payer: Self-pay | Admitting: Obstetrics & Gynecology

## 2019-10-10 VITALS — BP 121/74 | HR 79 | Wt 352.9 lb

## 2019-10-10 DIAGNOSIS — Z975 Presence of (intrauterine) contraceptive device: Secondary | ICD-10-CM | POA: Diagnosis not present

## 2019-10-10 DIAGNOSIS — N921 Excessive and frequent menstruation with irregular cycle: Secondary | ICD-10-CM | POA: Diagnosis not present

## 2019-10-10 LAB — CBC
Hematocrit: 40.5 % (ref 34.0–46.6)
Hemoglobin: 11.7 g/dL (ref 11.1–15.9)
MCH: 20.4 pg — ABNORMAL LOW (ref 26.6–33.0)
MCHC: 28.9 g/dL — ABNORMAL LOW (ref 31.5–35.7)
MCV: 71 fL — ABNORMAL LOW (ref 79–97)
NRBC: 1 % — ABNORMAL HIGH (ref 0–0)
Platelets: 417 10*3/uL (ref 150–450)
RBC: 5.73 x10E6/uL — ABNORMAL HIGH (ref 3.77–5.28)
RDW: 15.5 % — ABNORMAL HIGH (ref 11.7–15.4)
WBC: 8.7 10*3/uL (ref 3.4–10.8)

## 2019-10-10 MED ORDER — CELECOXIB 200 MG PO CAPS: 200.0000 mg | ORAL_CAPSULE | Freq: Every day | ORAL | 0 refills | Status: AC

## 2019-10-10 MED ORDER — NORETHIN ACE-ETH ESTRAD-FE 1-20 MG-MCG PO TABS: 1.0000 | ORAL_TABLET | Freq: Every day | ORAL | 0 refills | Status: AC

## 2019-10-10 NOTE — Progress Notes (Signed)
    GYNECOLOGY OFFICE ENCOUNTER NOTE  History:  34 y.o. [id number] here today for today for IUD string check and also reports heavy, irregular bleeding since Liletta IUD placement on 08/16/2019. Has to wear multiple tampons daily and she is very frustrated. No other concerning side effects.  The following portions of the patient's history were reviewed and updated as appropriate: allergies, current medications, past family history, past medical history, past social history, past surgical history and problem list. Last pap smear on 10/11/2018 was normal  Review of Systems:  Pertinent items are noted in HPI.   Objective:  Physical Exam Blood pressure 121/74, pulse 79, weight (!) 352 lb 14.4 oz (160.1 kg), unknown if currently breastfeeding. CONSTITUTIONAL: Well-developed, well-nourished female in no acute distress.  HENT:  Normocephalic, atraumatic. External right and left ear normal. Oropharynx is clear and moist EYES: Conjunctivae and EOM are normal. Pupils are equal, round, and reactive to light. No scleral icterus.  NECK: Normal range of motion, supple, no masses CARDIOVASCULAR: Normal heart rate noted RESPIRATORY: Effort and breath sounds normal, no problems with respiration noted ABDOMEN: Soft, no distention noted.   PELVIC: Normal appearing external genitalia; normal appearing vaginal mucosa and cervix.Scant brown discharge seen, no active bleeding. Liletta IUD blue strings visualized, about 3 cm in length outside cervix.   Assessment & Plan:  1. Breakthrough bleeding with IUD Given the amount of bleeding reported, will check CBC.  Discussed that irregular bleeding can occur after IUD placement for up to 6 months after placement.  But given her frustration, will try to mitigate this side effect. Prescribed course of Celebrex and low dose OCPs, told her to expect period after OCPs regimen. Patients are usually better after this regimen is done.  Will follow up response.  - CBC -  norethindrone-ethinyl estradiol (LOESTRIN FE 1/20) 1-20 MG-MCG tablet; Take 1 tablet by mouth daily.  Dispense: 28 tablet; Refill: 0 - celecoxib (CELEBREX) 200 MG capsule; Take 1 capsule (200 mg total) by mouth daily for 10 days.  Dispense: 10 capsule; Refill: 0 Patient to keep IUD in place for up to seven years; can come in for removal if she desires pregnancy earlier or for any concerning side effects.  Total face-to-face time with patient: 15 minutes. Over 50% of encounter was spent on counseling and coordination of care.    Jaynie Collins, MD, FACOG Obstetrician & Gynecologist, [place] for [employer], [facility]

## 2019-11-11 ENCOUNTER — Ambulatory Visit: Payer: Medicare (Managed Care) | Admitting: Obstetrics & Gynecology

## 2019-11-11 ENCOUNTER — Encounter: Payer: Self-pay | Admitting: Obstetrics & Gynecology

## 2020-03-26 ENCOUNTER — Other Ambulatory Visit: Payer: Self-pay | Admitting: Family Medicine

## 2020-03-26 DIAGNOSIS — F418 Other specified anxiety disorders: Secondary | ICD-10-CM

## 2020-04-17 ENCOUNTER — Other Ambulatory Visit (HOSPITAL_COMMUNITY): Payer: Self-pay | Admitting: Surgery

## 2020-04-17 ENCOUNTER — Other Ambulatory Visit: Payer: Self-pay | Admitting: Surgery

## 2020-04-27 ENCOUNTER — Ambulatory Visit (HOSPITAL_COMMUNITY): Admission: RE | Admit: 2020-04-27 | Payer: Medicare (Managed Care) | Source: Ambulatory Visit

## 2020-04-27 ENCOUNTER — Encounter (HOSPITAL_COMMUNITY): Payer: Self-pay

## 2020-06-19 ENCOUNTER — Ambulatory Visit: Payer: Medicare (Managed Care) | Admitting: Skilled Nursing Facility1

## 2020-06-27 ENCOUNTER — Encounter: Payer: Self-pay | Admitting: Skilled Nursing Facility1

## 2020-06-27 ENCOUNTER — Encounter: Payer: Medicare (Managed Care) | Attending: Surgery | Admitting: Skilled Nursing Facility1

## 2020-06-27 ENCOUNTER — Other Ambulatory Visit: Payer: Self-pay

## 2020-06-27 DIAGNOSIS — Z6841 Body Mass Index (BMI) 40.0 and over, adult: Secondary | ICD-10-CM | POA: Diagnosis not present

## 2020-06-27 DIAGNOSIS — Z794 Long term (current) use of insulin: Secondary | ICD-10-CM | POA: Diagnosis not present

## 2020-06-27 DIAGNOSIS — Z7984 Long term (current) use of oral hypoglycemic drugs: Secondary | ICD-10-CM | POA: Diagnosis not present

## 2020-06-27 DIAGNOSIS — E119 Type 2 diabetes mellitus without complications: Secondary | ICD-10-CM | POA: Diagnosis present

## 2020-06-27 NOTE — Progress Notes (Signed)
Nutrition Assessment for Bariatric Surgery Medical Nutrition Therapy Appt Start Time: 2:51 End Time: 3:50  Patient was seen on 06/27/2020 for Pre-Operative Nutrition Assessment. Letter of approval faxed to [facility] bariatric surgery program coordinator on 06/27/2020   Referral stated Supervised Weight Loss (SWL) visits needed: 6  Planned surgery: sleeve agstrefcyom  Pt expectation of surgery: to lose weight and cure diabetes Pt expectation of dietitian: none stated    NUTRITION ASSESSMENT   Anthropometrics  Start weight at [facility]: 306.8 lbs (date: 06/27/2020)  Height: 70.5 in BMI: 43.40 kg/m2     Clinical  Medical hx: DM Medications: lantus, ozempic, metformin Labs: last A1C seen 6.5 Notable signs/symptoms: lower back pain Any previous deficiencies? No  Micronutrient Nutrition Focused Physical Exam: Hair: No issues observed Eyes: No issues observed Mouth: No issues observed Neck: No issues observed Nails: No issues observed Skin: No issues observed  Lifestyle & Dietary Hx   Pt states she was able to too lose 100 pounds but still wants the surgery. Pt states she is getting the surgery to cure her diabetes  Stating she has only 6-7 months (hx from over a year ago noted) Pt states she has not been homeless for 8 months but given a voucher recently but still homeless so living in a hotel room with her 4 children having trouble affording food so has not been sleeping because at night is the only peaceful time she has with her kids being quiet.  Pt states she is out of lantus and ozempic right now waiting for it to be refilled.  Pt states she really loves fried chicken and will have troubles giving that up.     24-Hr Dietary Recall First Meal: skipped or egg sandwich or cereal Snack:  Second Meal: sandwich + pineapples Snack: ice cream Third Meal: skipped or sal[place] steak + onions + rice + cabbage Snack: cereal or fruit or ice cream Beverages: water, diet  soda   Estimated Energy Needs Calories: 1600   NUTRITION DIAGNOSIS  Overweight/obesity ([place]-3.3) related to past poor dietary habits and physical inactivity as evidenced by patient w/ planned sleeve gastrectomy surgery following dietary guidelines for continued weight loss.    NUTRITION INTERVENTION  Nutrition counseling (C-1) and education (E-2) to facilitate bariatric surgery goals.   Pre-Op Goals Reviewed with the Patient . Track food and beverage intake (pen and paper, MyFitness Pal, Baritastic app, etc.) . Make healthy food choices while monitoring portion sizes . Consume 3 meals per day or try to eat every 3-5 hours . Avoid concentrated sugars and fried foods: avoid fried chicken . Keep sugar & fat in the single digits per serving on food labels . Practice CHEWING your food (aim for applesauce consistency) . Practice not drinking 15 minutes before, during, and 30 minutes after each meal and snack . Avoid all carbonated beverages (ex: soda, sparkling beverages)  . Limit caffeinated beverages (ex: coffee, tea, energy drinks) . Avoid all sugar-sweetened beverages (ex: regular soda, sports drinks)  . Avoid alcohol  . Aim for 64-100 ounces of FLUID daily (with at least half of fluid intake being plain water)  . Aim for at least 60-80 grams of PROTEIN daily . Look for a liquid protein source that contains ?15 g protein and ?5 g carbohydrate (ex: shakes, drinks, shots) . Make a list of non-food related activities . Physical activity is an important part of a healthy lifestyle so keep it moving! The goal is to reach 150 minutes of exercise per week,  including cardiovascular and weight baring activity.  *Goals that are bolded indicate the pt would like to start working towards these  Handouts Provided Include  . Bariatric Surgery handouts (Nutrition Visits, Pre-Op Goals, Protein Shakes, Vitamins & Minerals)  Learning Style & Readiness for Change Teaching method utilized: Visual &  Auditory  Demonstrated degree of understanding via: Teach Back  Readiness Level: pre contemplative  Barriers to learning/adherence to lifestyle change: living situation  RD's Notes for Next Visit . Assess pts adherence to chosen goals     MONITORING & EVALUATION Dietary intake, weekly physical activity, body weight, and pre-op goals reached at next nutrition visit.    Next Steps  Patient is to follow up at [facility] for SWL

## 2020-07-16 ENCOUNTER — Ambulatory Visit: Payer: Medicare (Managed Care) | Admitting: Skilled Nursing Facility1

## 2020-07-24 ENCOUNTER — Encounter: Payer: Medicare (Managed Care) | Admitting: Skilled Nursing Facility1

## 2020-07-30 ENCOUNTER — Encounter: Payer: Self-pay | Admitting: *Deleted

## 2020-08-01 ENCOUNTER — Other Ambulatory Visit: Payer: Self-pay

## 2020-08-01 ENCOUNTER — Encounter: Payer: Medicare (Managed Care) | Attending: Surgery | Admitting: Skilled Nursing Facility1

## 2020-08-01 DIAGNOSIS — Z794 Long term (current) use of insulin: Secondary | ICD-10-CM | POA: Diagnosis not present

## 2020-08-01 DIAGNOSIS — E119 Type 2 diabetes mellitus without complications: Secondary | ICD-10-CM | POA: Diagnosis present

## 2020-08-01 DIAGNOSIS — Z6841 Body Mass Index (BMI) 40.0 and over, adult: Secondary | ICD-10-CM | POA: Insufficient documentation

## 2020-08-01 DIAGNOSIS — Z713 Dietary counseling and surveillance: Secondary | ICD-10-CM | POA: Diagnosis not present

## 2020-08-01 NOTE — Progress Notes (Signed)
Supervised Weight Loss Visit Bariatric Nutrition Education  Planned Surgery: sleeve gastrectomy Pt Expectation of Surgery/ Goals: to lose weight  1 out of 6 SWL Appointments   NUTRITION ASSESSMENT  Anthropometrics  Start weight at [facility]: 306.8 lbs (date: 06/27/2020)  Weight: 301.4 Height: 70.5 in BMI: 42.64 kg/m2     Clinical  Medical hx: DM Medications: lantus, ozempic, metformin Labs: last A1C seen 6.5 Notable signs/symptoms: lower back pain Any previous deficiencies? No   Lifestyle & Dietary Hx  Pt state she has cut out some snacks and stopped eating junk food and cut out fried chicken now cooking her chicken in hot sauce instead. Pt states her daughter motivates her.  Pt states she is taking her ozempic and her lantus again Pt states her blood sugars have been 90; checks her blood sugars at her friends house using their meter; stating her meter is packed away in a box somewhere and will not be able to find it: Dietitian gave pt Accu check lot [id number]  Exp: 2021/08/24  Advising pt she needs to get another meter and strips as these strips will only lasta  Couple days   Diabetes education for next time  Estimated daily fluid intake:  oz Supplements:  Current average weekly physical activity: states she does not need anything other than cleaning up after her children  24-Hr Dietary Recall: typically skipping 1-2 meals First Meal 9am: 3 boiled hot wings or rice and beef tips Snack:  Second Meal: skipped Snack: pineapple and watermelon Third Meal: boiled hot wings  Snack:  Beverages: 3 bottles water, diet soda  Estimated Energy Needs Calories: 1600   NUTRITION DIAGNOSIS  Overweight/obesity ([place]-3.3) related to past poor dietary habits and physical inactivity as evidenced by patient w/ planned sleeve surgery following dietary guidelines for continued weight loss.   NUTRITION INTERVENTION  Nutrition counseling (C-1) and education (E-2) to facilitate bariatric surgery  goals.  Pre-Op Goals Progress & New Goals . Track food and beverage intake (did not bring it)-did not find this helpful . Continue: Avoid concentrated sugars and fried foods: avoid fried chicken . NEW: Eat lunch  Handouts Provided Include   N/A  Learning Style & Readiness for Change Teaching method utilized: Visual & Auditory  Demonstrated degree of understanding via: Teach Back  Readiness Level: Action Barriers to learning/adherence to lifestyle change: none identified   RD's Notes for next Visit  . Assess pts adherence to chosen goals . Educate on Diabetes   MONITORING & EVALUATION Dietary intake, weekly physical activity, body weight, and pre-op goals in 1 month.   Next Steps  Patient is to return to [facility] for next SWL

## 2020-08-14 ENCOUNTER — Ambulatory Visit: Payer: Medicare (Managed Care) | Admitting: Skilled Nursing Facility1

## 2020-08-30 ENCOUNTER — Encounter: Payer: Medicare (Managed Care) | Attending: Surgery | Admitting: Skilled Nursing Facility1

## 2020-08-30 ENCOUNTER — Other Ambulatory Visit: Payer: Self-pay

## 2020-08-30 DIAGNOSIS — Z6841 Body Mass Index (BMI) 40.0 and over, adult: Secondary | ICD-10-CM | POA: Diagnosis not present

## 2020-08-30 NOTE — Progress Notes (Signed)
Supervised Weight Loss Visit Bariatric Nutrition Education  Planned Surgery: sleeve gastrectomy Pt Expectation of Surgery/ Goals: to lose weight  2 out of 6 SWL Appointments   NUTRITION ASSESSMENT  Anthropometrics  Start weight at [facility]: 306.8 lbs (date: 06/27/2020)  Weight: 294.7 pounds Height: 70.5 in BMI: 41.69 kg/m2     Clinical  Medical hx: DM Medications: lantus, ozempic, metformin Labs: last A1C seen 6.5 Notable signs/symptoms: lower back pain Any previous deficiencies? No   Lifestyle & Dietary Hx   Pt arrives with a black eye stating her brother gave it to her stating she is taking care of it with the magistrate: pt states she currently does not feel threatened or harmed. Pt state she did get a new a new glucometer. Pt states she has been checking her blood sugars but did not bring her notebook with her today: numbers reported-144, 112  Unable to go over diabetes education due to pt being late and have distractions in office.   Diabetes education for next time  Estimated daily fluid intake:  oz Supplements:  Current average weekly physical activity: states she does not need anything other than cleaning up after her children  24-Hr Dietary Recall: typically skipping 1-2 meals First Meal: skipped or fruit Snack:  Second Meal 11: fast food Snack: pineapple and watermelon Third Meal: boiled hot wings + rice or mac n cheeses  Snack:  Beverages: 3 bottles water, diet soda, 2% milk  Estimated Energy Needs Calories: 1600   NUTRITION DIAGNOSIS  Overweight/obesity (-3.3) related to past poor dietary habits and physical inactivity as evidenced by patient w/ planned sleeve surgery following dietary guidelines for continued weight loss.   NUTRITION INTERVENTION  Nutrition counseling (C-1) and education (E-2) to facilitate bariatric surgery goals.  Pre-Op Goals Progress & New Goals . Track food and beverage intake (did not bring it)-did not find this  helpful . Continue: Avoid concentrated sugars and fried foods: avoid fried chicken . continue: Eat lunch . NEW: eat breakfast such as yogurt . NEW: work on meal prep  Handouts Provided Include   N/A  Learning Style & Readiness for Change Teaching method utilized: Visual & Auditory  Demonstrated degree of understanding via: Teach Back  Readiness Level: Action Barriers to learning/adherence to lifestyle change: none identified   RD's Notes for next Visit  . Assess pts adherence to chosen goals . Educate on Diabetes   MONITORING & EVALUATION Dietary intake, weekly physical activity, body weight, and pre-op goals in 1 month.   Next Steps  Patient is to return to [facility] for next SWL

## 2020-10-03 ENCOUNTER — Ambulatory Visit: Payer: Medicare (Managed Care) | Admitting: Skilled Nursing Facility1

## 2020-10-15 ENCOUNTER — Other Ambulatory Visit: Payer: Self-pay

## 2020-10-15 ENCOUNTER — Encounter: Payer: Medicare (Managed Care) | Attending: Surgery | Admitting: Skilled Nursing Facility1

## 2020-10-15 DIAGNOSIS — E119 Type 2 diabetes mellitus without complications: Secondary | ICD-10-CM | POA: Diagnosis not present

## 2020-10-15 DIAGNOSIS — Z6841 Body Mass Index (BMI) 40.0 and over, adult: Secondary | ICD-10-CM | POA: Insufficient documentation

## 2020-10-15 NOTE — Progress Notes (Signed)
Supervised Weight Loss Visit Bariatric Nutrition Education  Planned Surgery: sleeve gastrectomy Pt Expectation of Surgery/ Goals: to lose weight  3 out of 6 SWL Appointments   NUTRITION ASSESSMENT  Anthropometrics  Start weight at [facility]: 306.8 lbs (date: 06/27/2020)  Weight: 294.7 pounds Height: 70.5 in BMI: 41.13 kg/m2     Clinical  Medical hx: DM Medications: lantus, ozempic, metformin Labs: last A1C seen 6.5 Notable signs/symptoms: lower back pain Any previous deficiencies? No   Lifestyle & Dietary Hx  Pt state she has not had her lantus stating her physician will not do a new prescription for her lantus until she goes in for an appointment stating she cannot get there because she needed to clean her house ad take care of her kids.  Pt state she did get a new house stating it is pretty much a squatters house so she has been cleaning it to make it better. Pt states she got an insulin from her friend.  Pt states she will check her blood sugars when she has strips sating she thinks they aware about 140-155.  Pt states she does have a fridge and stove/oven and running water.  Pt states he has to pay 600 dollars to move her to [place] to her  house and needs to get her stuff out of storage.   Estimated daily fluid intake:  oz Supplements:  Current average weekly physical activity: states she does not need anything other than cleaning up after her children  24-Hr Dietary Recall: typically skipping 1-2 meals First Meal: salmon patty + boiled egg + strawberries Snack:  Second Meal 11: frozen french fries  Snack: pineapple and watermelon Third Meal: boiled hot wings + rice or mac n cheeses or cereal Snack:  Beverages: 3 bottles water, diet soda, 2% milk  Estimated Energy Needs Calories: 1600   NUTRITION DIAGNOSIS  Overweight/obesity ([place]-3.3) related to past poor dietary habits and physical inactivity as evidenced by patient w/ planned sleeve surgery following dietary  guidelines for continued weight loss.   NUTRITION INTERVENTION  Nutrition counseling (C-1) and education (E-2) to facilitate bariatric surgery goals.  Pre-Op Goals Progress & New Goals Track food and beverage intake (did not bring it)-did not find this helpful Continue: Avoid concentrated sugars and fried foods: avoid fried chicken continue: Eat lunch continue: eat breakfast such as yogurt continue: work on meal prep NEW: drink faucet water NEW: get car towed and get your stuff out of storage  NEW:  call those numebrs sent for the insulin coverage   Handouts Provided Include  Emailed Insulin help  Learning Style & Readiness for Change Teaching method utilized: Visual & Auditory  Demonstrated degree of understanding via: Teach Back  Readiness Level: Action Barriers to learning/adherence to lifestyle change: homelessness  RD's Notes for next Visit  Assess pts adherence to chosen goals Educate on Diabetes   MONITORING & EVALUATION Dietary intake, weekly physical activity, body weight, and pre-op goals in 1 month.   Next Steps  Patient is to return to [facility] for next SWL

## 2020-11-12 ENCOUNTER — Emergency Department (HOSPITAL_COMMUNITY): Payer: Medicare (Managed Care)

## 2020-11-12 ENCOUNTER — Other Ambulatory Visit: Payer: Self-pay

## 2020-11-12 ENCOUNTER — Encounter (HOSPITAL_COMMUNITY): Payer: Self-pay

## 2020-11-12 ENCOUNTER — Emergency Department (HOSPITAL_COMMUNITY)
Admission: EM | Admit: 2020-11-12 | Discharge: 2020-11-12 | Disposition: A | Discharge status: Home / Self Care | Payer: Medicare (Managed Care) | Attending: Emergency Medicine | Admitting: Emergency Medicine

## 2020-11-12 DIAGNOSIS — R112 Nausea with vomiting, unspecified: Secondary | ICD-10-CM | POA: Diagnosis not present

## 2020-11-12 DIAGNOSIS — R079 Chest pain, unspecified: Secondary | ICD-10-CM | POA: Diagnosis not present

## 2020-11-12 DIAGNOSIS — M25511 Pain in right shoulder: Secondary | ICD-10-CM | POA: Diagnosis not present

## 2020-11-12 DIAGNOSIS — E119 Type 2 diabetes mellitus without complications: Secondary | ICD-10-CM | POA: Diagnosis not present

## 2020-11-12 DIAGNOSIS — Z5321 Procedure and treatment not carried out due to patient leaving prior to being seen by health care provider: Secondary | ICD-10-CM | POA: Insufficient documentation

## 2020-11-12 DIAGNOSIS — M25512 Pain in left shoulder: Secondary | ICD-10-CM | POA: Diagnosis not present

## 2020-11-12 DIAGNOSIS — R519 Headache, unspecified: Secondary | ICD-10-CM | POA: Insufficient documentation

## 2020-11-12 LAB — I-STAT BETA HCG BLOOD, ED (MC, WL, AP ONLY): I-stat hCG, quantitative: <5 m[IU]/mL (ref <5)

## 2020-11-12 LAB — CBC
HCT: 42.7 % (ref 36.0–46.0)
Hemoglobin: 13.4 g/dL (ref 12.0–15.0)
MCH: 22 pg — ABNORMAL LOW (ref 26.0–34.0)
MCHC: 31.4 g/dL (ref 30.0–36.0)
MCV: 70.2 fL — ABNORMAL LOW (ref 80.0–100.0)
Platelets: 391 10*3/uL (ref 150–400)
RBC: 6.08 MIL/uL — ABNORMAL HIGH (ref 3.87–5.11)
RDW: 15.6 % — ABNORMAL HIGH (ref 11.5–15.5)
WBC: 10.9 10*3/uL — ABNORMAL HIGH (ref 4.0–10.5)
nRBC: 0 % (ref 0.0–0.2)

## 2020-11-12 LAB — COMPREHENSIVE METABOLIC PANEL
ALT: 22 U/L (ref 0–44)
AST: 21 U/L (ref 15–41)
Albumin: 3.6 g/dL (ref 3.5–5.0)
Alkaline Phosphatase: 91 U/L (ref 38–126)
Anion gap: 8 (ref 5–15)
BUN: 11 mg/dL (ref 6–20)
CO2: 24 mmol/L (ref 22–32)
Calcium: 9.5 mg/dL (ref 8.9–10.3)
Chloride: 103 mmol/L (ref 98–111)
Creatinine, Ser: 0.76 mg/dL (ref 0.44–1.00)
GFR, Estimated: >60 mL/min (ref >60)
Glucose, Bld: 114 mg/dL — ABNORMAL HIGH (ref 70–99)
Potassium: 4 mmol/L (ref 3.5–5.1)
Sodium: 135 mmol/L (ref 135–145)
Total Bilirubin: 0.5 mg/dL (ref 0.3–1.2)
Total Protein: 7.4 g/dL (ref 6.5–8.1)

## 2020-11-12 LAB — CBG MONITORING, ED: Glucose-Capillary: 115 mg/dL — ABNORMAL HIGH (ref 70–99)

## 2020-11-12 LAB — TROPONIN I (HIGH SENSITIVITY)
Troponin I (High Sensitivity): 4 ng/L (ref <18)
Troponin I (High Sensitivity): 3 ng/L (ref <18)

## 2020-11-12 MED ORDER — ONDANSETRON 4 MG PO TBDP
4.0000 mg | ORAL_TABLET | Freq: Once | ORAL | Status: AC
  Administered 2020-11-12: 4 mg via ORAL
  Filled 2020-11-12: qty 1

## 2020-11-12 MED ORDER — ACETAMINOPHEN 500 MG PO TABS: 1000.0000 mg | ORAL_TABLET | Freq: Once | ORAL | Status: DC

## 2020-11-12 NOTE — ED Notes (Signed)
Called pt x2 for vitals, no response. °

## 2020-11-12 NOTE — ED Triage Notes (Signed)
Patient complains of bilateral shoulder pain and chest pain since last night. Patient reports that the pain is much worse with movement and inspiration. No resp illness. Alert and oriented

## 2020-11-12 NOTE — ED Notes (Signed)
Pt not responding for vital recheck x2

## 2020-11-12 NOTE — ED Provider Notes (Signed)
Emergency Medicine Provider Triage Evaluation Note  Suzanne Bonilla , a 35 y.o. female  was evaluated in triage.  Pt complains of chest pain, headache, nausea, vomiting.  Symptoms began yesterday.  History of diabetes, has had difficulty with her blood sugars.  Review of Systems  Positive: Ha, cp, n/v Negative: fever  Physical Exam  BP 124/88 (BP Location: Right Arm)   Pulse 74   Temp 99.5 F (37.5 C) (Oral)   Resp 18   SpO2 100%  Gen:   Awake, no distress   Resp:  Normal effort. Clear lung sounds MSK:   Moves extremities without difficulty   Medical Decision Making  Medically screening exam initiated at 10:18 AM.  Appropriate orders placed.  Suzanne Bonilla was informed that the remainder of the evaluation will be completed by another provider, this initial triage assessment does not replace that evaluation, and the importance of remaining in the ED until their evaluation is complete.  Labs, ua, ekg, cxr   Suzanne Apley, PA-C 11/12/20 1020    Mancel Bale, MD 11/13/20 2204

## 2020-11-15 ENCOUNTER — Encounter: Payer: Medicare (Managed Care) | Attending: Surgery | Admitting: Skilled Nursing Facility1

## 2020-11-15 ENCOUNTER — Other Ambulatory Visit: Payer: Self-pay

## 2020-11-15 DIAGNOSIS — E119 Type 2 diabetes mellitus without complications: Secondary | ICD-10-CM | POA: Insufficient documentation

## 2020-11-15 DIAGNOSIS — Z7984 Long term (current) use of oral hypoglycemic drugs: Secondary | ICD-10-CM | POA: Insufficient documentation

## 2020-11-15 NOTE — Progress Notes (Signed)
Supervised Weight Loss Visit Bariatric Nutrition Education  Planned Surgery: sleeve gastrectomy Pt Expectation of Surgery/ Goals: to lose weight  4 out of 6 SWL Appointments   NUTRITION ASSESSMENT  Anthropometrics  Start weight at [facility]: 306.8 lbs (date: 06/27/2020)  Weight: 293.9 pounds Height: 70.5 in BMI: 41.57 kg/m 2     Clinical  Medical hx: DM Medications: lantus, ozempic, metformin Labs: last A1C seen 6.5 Notable signs/symptoms: lower back pain Any previous deficiencies? No   Lifestyle & Dietary Hx  Pt states she does have a fridge and stove/oven and running water.   Pt state she has had pain and tingling in her fingers: Dietitian advised to speak with her doctor about. Pt states her blood sugars gave been about 115 checking once to twice a day. Pt states she does have access to insulin. Pt states sometimes she has nausea stating it is from her acid reflux. Pt states her appetite has been funny.   Estimated daily fluid intake:  oz Supplements:  Current average weekly physical activity: states she does not need anything other than cleaning up after her children  24-Hr Dietary Recall: typically skipping 1-2 meals First Meal: salmon patty + boiled egg + strawberries or will stop at [employer] and get a  potato salad Snack:  Second Meal 11: frozen french fries or beef jerky Snack: pineapple and watermelon Third Meal: boiled hot wings + rice or mac n cheeses or cereal or hamburger helper Snack:  Beverages: 3 bottles water, diet soda, 2% milk  Estimated Energy Needs Calories: 1600   NUTRITION DIAGNOSIS  Overweight/obesity ([place]-3.3) related to past poor dietary habits and physical inactivity as evidenced by patient w/ planned sleeve surgery following dietary guidelines for continued weight loss.   NUTRITION INTERVENTION  Nutrition counseling (C-1) and education (E-2) to facilitate bariatric surgery goals.  Pre-Op Goals Progress & New Goals Track food and beverage  intake (did not bring it)-did not find this helpful Continue: Avoid concentrated sugars and fried foods: avoid fried chicken continue: Eat lunch continue: eat breakfast such as yogurt continue: work on meal prep continue: drink faucet water continue: get car towed and get your stuff out of storage  continue:  call those numebrs sent for the insulin coverage  NEW: do the [employer] stuff on the track 3 days a week  Handouts Previously Provided Include  Emailed Insulin help  Learning Style & Readiness for Change Teaching method utilized: Visual & Auditory  Demonstrated degree of understanding via: Teach Back  Readiness Level: Action Barriers to learning/adherence to lifestyle change: homelessness  RD's Notes for next Visit  Assess pts adherence to chosen goals   MONITORING & EVALUATION Dietary intake, weekly physical activity, body weight, and pre-op goals in 1 month.   Next Steps  Patient is to return to [facility] for next SWL

## 2020-11-29 ENCOUNTER — Other Ambulatory Visit (HOSPITAL_COMMUNITY): Payer: Self-pay | Admitting: Surgery

## 2020-12-05 ENCOUNTER — Encounter: Payer: Medicare (Managed Care) | Admitting: Skilled Nursing Facility1

## 2020-12-11 ENCOUNTER — Other Ambulatory Visit: Payer: Self-pay

## 2020-12-11 ENCOUNTER — Encounter: Payer: Medicare (Managed Care) | Attending: Surgery | Admitting: Skilled Nursing Facility1

## 2020-12-11 DIAGNOSIS — Z6839 Body mass index (BMI) 39.0-39.9, adult: Secondary | ICD-10-CM | POA: Diagnosis not present

## 2020-12-11 DIAGNOSIS — Z713 Dietary counseling and surveillance: Secondary | ICD-10-CM | POA: Insufficient documentation

## 2020-12-11 DIAGNOSIS — E119 Type 2 diabetes mellitus without complications: Secondary | ICD-10-CM | POA: Insufficient documentation

## 2020-12-11 DIAGNOSIS — Z7984 Long term (current) use of oral hypoglycemic drugs: Secondary | ICD-10-CM | POA: Diagnosis not present

## 2020-12-11 NOTE — Progress Notes (Signed)
Supervised Weight Loss Visit Bariatric Nutrition Education  Planned Surgery: sleeve gastrectomy Pt Expectation of Surgery/ Goals: to lose weight  5 out of 6 SWL Appointments   NUTRITION ASSESSMENT  Anthropometrics  Start weight at [facility]: 306.8 lbs (date: 06/27/2020)  Weight: 281 pounds Height: 70.5 in BMI: 39.75 kg/m 2     Clinical  Medical hx: DM Medications: lantus, ozempic, metformin, phentermine  Labs: last A1C seen 6.5 Notable signs/symptoms: lower back pain Any previous deficiencies? No   Lifestyle & Dietary Hx  Pt states she does have a fridge and stove/oven and running water.  Pt states she does take phentermine.  Pt state she is trying to get a CGM. Pt states she checks her blood sugars 2 times a day once fasting and once before bed: 87-130.  Pt states she has had much more energy since increasing her activity.  Pt state she has has enjoyed more flexibility in her body.  Pt states her diagnosis of diabetes is what really motivated her to make changes and needing to be there for her children.   Estimated daily fluid intake:  oz Supplements:  Current average weekly physical activity: states she does not need anything other than cleaning up after her children  24-Hr Dietary Recall: typically skipping 1-2 meals First Meal: salmon + rice Snack:  Second Meal 11: salmon + rice Snack: pineapple and watermelon Third Meal: boiled hot wings + rice or mac n cheeses or cereal or hamburger helper Snack:  Beverages: 4-5 bottles water, diet soda, 2% milk, red bull  Estimated Energy Needs Calories: 1600   NUTRITION DIAGNOSIS  Overweight/obesity ([place]-3.3) related to past poor dietary habits and physical inactivity as evidenced by patient w/ planned sleeve surgery following dietary guidelines for continued weight loss.   NUTRITION INTERVENTION  Nutrition counseling (C-1) and education (E-2) to facilitate bariatric surgery goals.  Pre-Op Goals Progress & New Goals Track  food and beverage intake (did not bring it)-did not find this helpful Continue: Avoid concentrated sugars and fried foods: avoid fried chicken continue: Eat lunch continue: eat breakfast such as yogurt continue: work on meal prep continue: drink faucet water continue: get car towed and get your stuff out of storage  continue:  call those [employer]s sent for the insulin coverage  Continue: do the [employer] stuff on the track 3 days a week NEW: eat more non starchy vegetables: daily  Handouts Previously Provided Include  Emailed Insulin help  Learning Style & Readiness for Change Teaching method utilized: Visual & Auditory  Demonstrated degree of understanding via: Teach Back  Readiness Level: Action Barriers to learning/adherence to lifestyle change: previous homelessness  RD's Notes for next Visit  Assess pts adherence to chosen goals   MONITORING & EVALUATION Dietary intake, weekly physical activity, body weight, and pre-op goals in 1 month.   Next Steps  Patient is to return to [facility] for next SWL

## 2020-12-28 ENCOUNTER — Ambulatory Visit (HOSPITAL_COMMUNITY): Admission: RE | Admit: 2020-12-28 | Payer: Medicare (Managed Care) | Source: Ambulatory Visit

## 2020-12-28 ENCOUNTER — Encounter (HOSPITAL_COMMUNITY): Payer: Self-pay

## 2021-01-11 ENCOUNTER — Other Ambulatory Visit: Payer: Self-pay

## 2021-01-11 ENCOUNTER — Ambulatory Visit (HOSPITAL_COMMUNITY)
Admission: RE | Admit: 2021-01-11 | Discharge: 2021-01-11 | Disposition: A | Discharge status: Home / Self Care | Payer: Medicare (Managed Care) | Source: Ambulatory Visit | Attending: Surgery | Admitting: Surgery

## 2021-01-15 ENCOUNTER — Encounter: Payer: Medicare (Managed Care) | Attending: Surgery | Admitting: Skilled Nursing Facility1

## 2021-01-15 ENCOUNTER — Other Ambulatory Visit: Payer: Self-pay

## 2021-01-15 DIAGNOSIS — Z713 Dietary counseling and surveillance: Secondary | ICD-10-CM | POA: Insufficient documentation

## 2021-01-15 DIAGNOSIS — E119 Type 2 diabetes mellitus without complications: Secondary | ICD-10-CM | POA: Insufficient documentation

## 2021-01-15 DIAGNOSIS — Z6839 Body mass index (BMI) 39.0-39.9, adult: Secondary | ICD-10-CM | POA: Insufficient documentation

## 2021-01-15 NOTE — Progress Notes (Signed)
Supervised Weight Loss Visit Bariatric Nutrition Education  Planned Surgery: sleeve gastrectomy Pt Expectation of Surgery/ Goals: to lose weight  6 out of 6 SWL Appointments   Pt completed visits.   Pt has cleared nutrition requirements.   NUTRITION ASSESSMENT  Anthropometrics  Start weight at [facility]: 306.8 lbs (date: 06/27/2020)  Weight: 276.6 pounds Height: 70.5 in BMI: 39.13 kg/m 2     Clinical  Medical hx: DM Medications: lantus, ozempic, metformin, phentermine  Labs: last A1C seen 6.5 Notable signs/symptoms: lower back pain Any previous deficiencies? No   Lifestyle & Dietary Hx  Pt states she does have a fridge and stove/oven and running water.  Pt states she does take phentermine.  Pt state she is trying to get a CGM. Pt states she checks her blood sugars 2 times a day once fasting and once before bed: 115-130.  Pt states her insulin has been increased.  Pt states she has added pool exercises.   Estimated daily fluid intake:  oz Supplements:  Current average weekly physical activity: states she does not need anything other than cleaning up after her children  24-Hr Dietary Recall: typically skipping 1-2 meals First Meal: salmon + rice Snack:  Second Meal 11: salmon + rice Snack: pineapple and watermelon Third Meal: boiled hot wings + rice or mac n cheeses or cereal or hamburger helper Snack:  Beverages: 4-5 bottles water, diet soda, 2% milk, red bull  Estimated Energy Needs Calories: 1600   NUTRITION DIAGNOSIS  Overweight/obesity ([place]-3.3) related to past poor dietary habits and physical inactivity as evidenced by patient w/ planned sleeve surgery following dietary guidelines for continued weight loss.   NUTRITION INTERVENTION  Nutrition counseling (C-1) and education (E-2) to facilitate bariatric surgery goals.  Pre-Op Goals Progress & New Goals Track food and beverage intake (did not bring it)-did not find this helpful Continue: Avoid concentrated  sugars and fried foods: avoid fried chicken continue: Eat lunch continue: eat breakfast such as yogurt continue: work on meal prep continue: drink faucet water continue: get car towed and get your stuff out of storage  continue:  call those [employer] sent for the insulin coverage  Continue: do the [employer] stuff on the track 3 days a week continue: eat more non starchy vegetables: daily  Handouts Previously Provided Include  Emailed Insulin help  Learning Style & Readiness for Change Teaching method utilized: Visual & Auditory  Demonstrated degree of understanding via: Teach Back  Readiness Level: Action Barriers to learning/adherence to lifestyle change: previous homelessness   MONITORING & EVALUATION Dietary intake, weekly physical activity, body weight, and pre-op goals  Next Steps  Patient is to return to [facility] for Pre-op class Pt has completed visits. No further supervised visits required by insurance

## 2021-11-22 IMAGING — CR DG CHEST 2V
2 series · 2 of 2 positions shown · non-contrast
Comparison: None.

CLINICAL DATA: Bilateral shoulder pain and chest pain

EXAM:
CHEST - 2 VIEW

[chest pa]
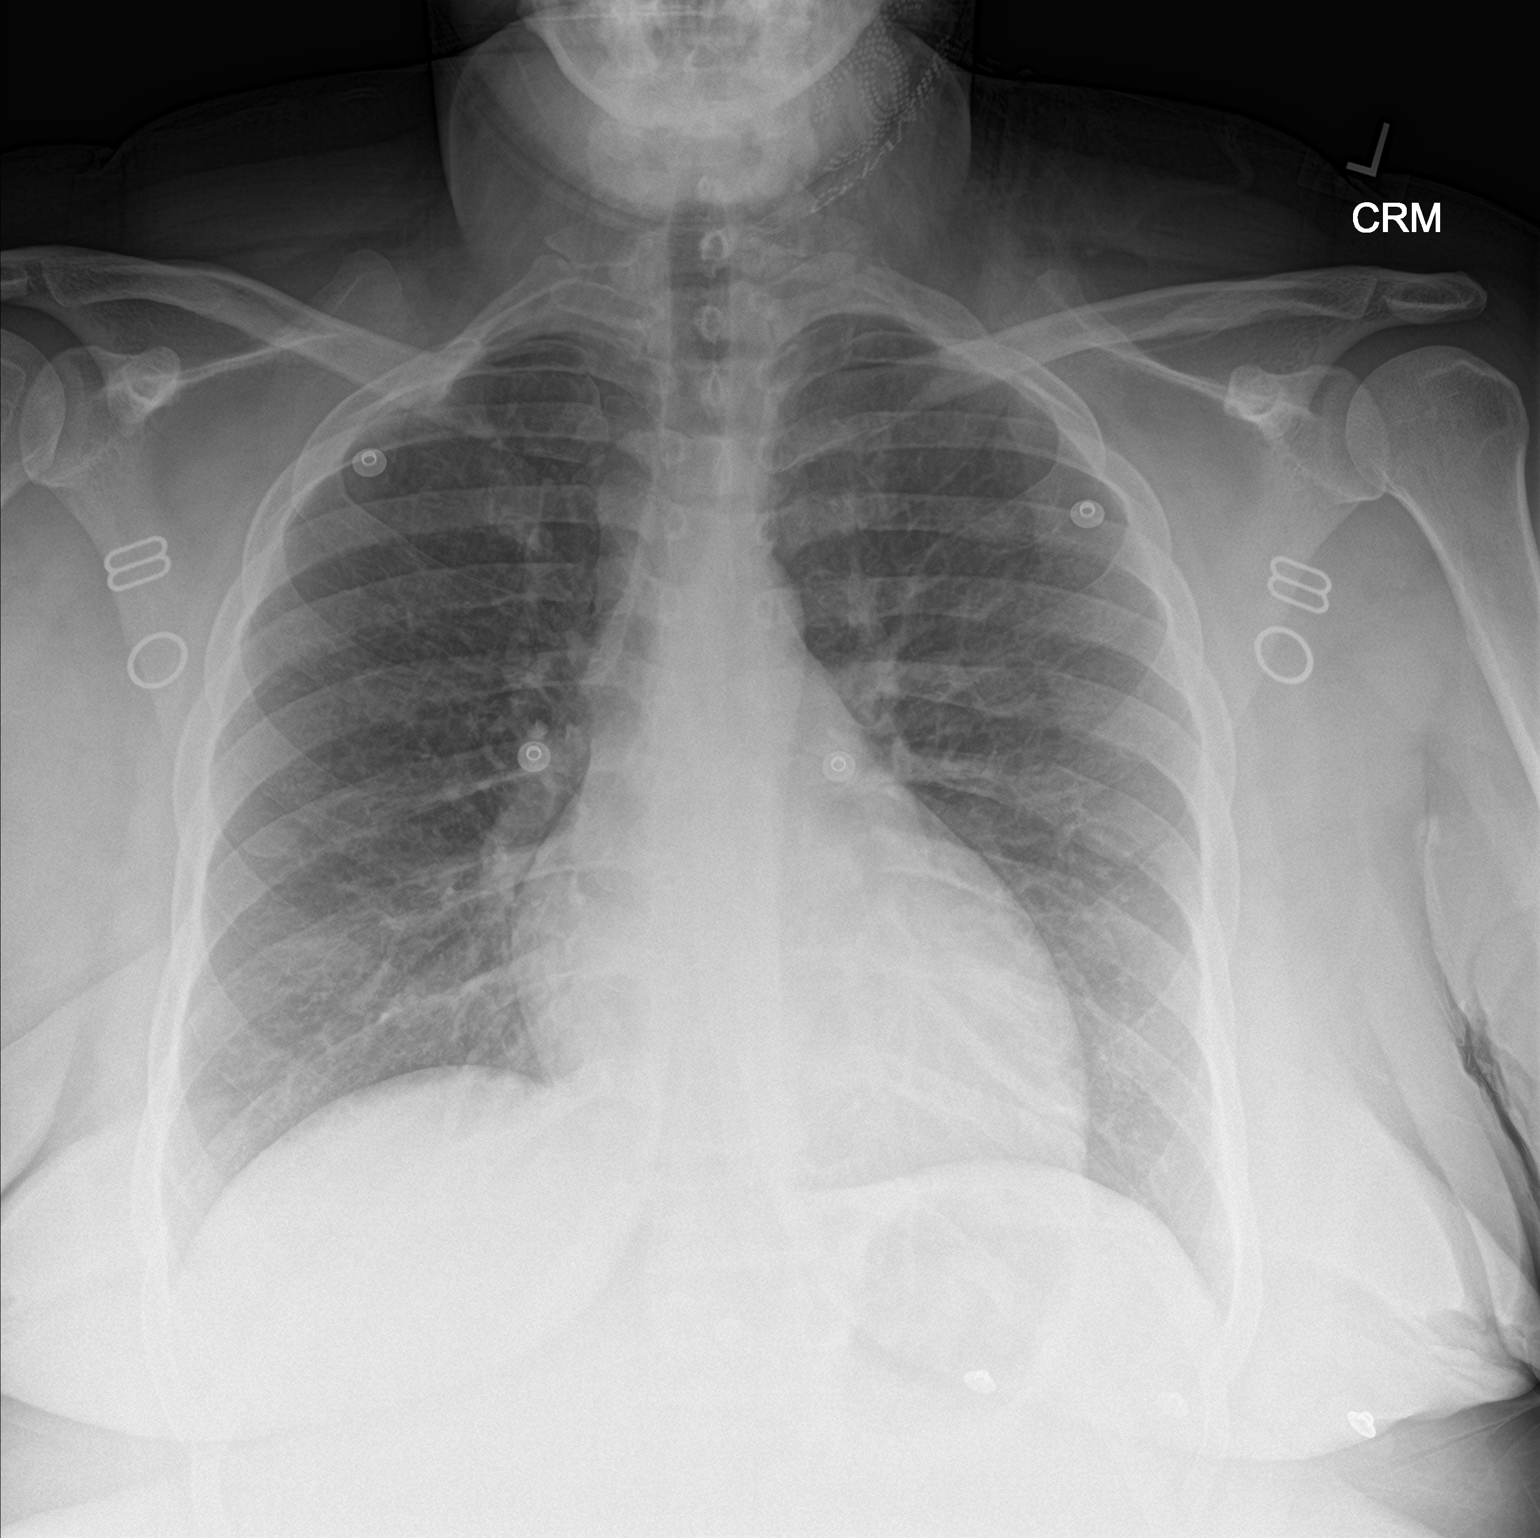

[chest lat]
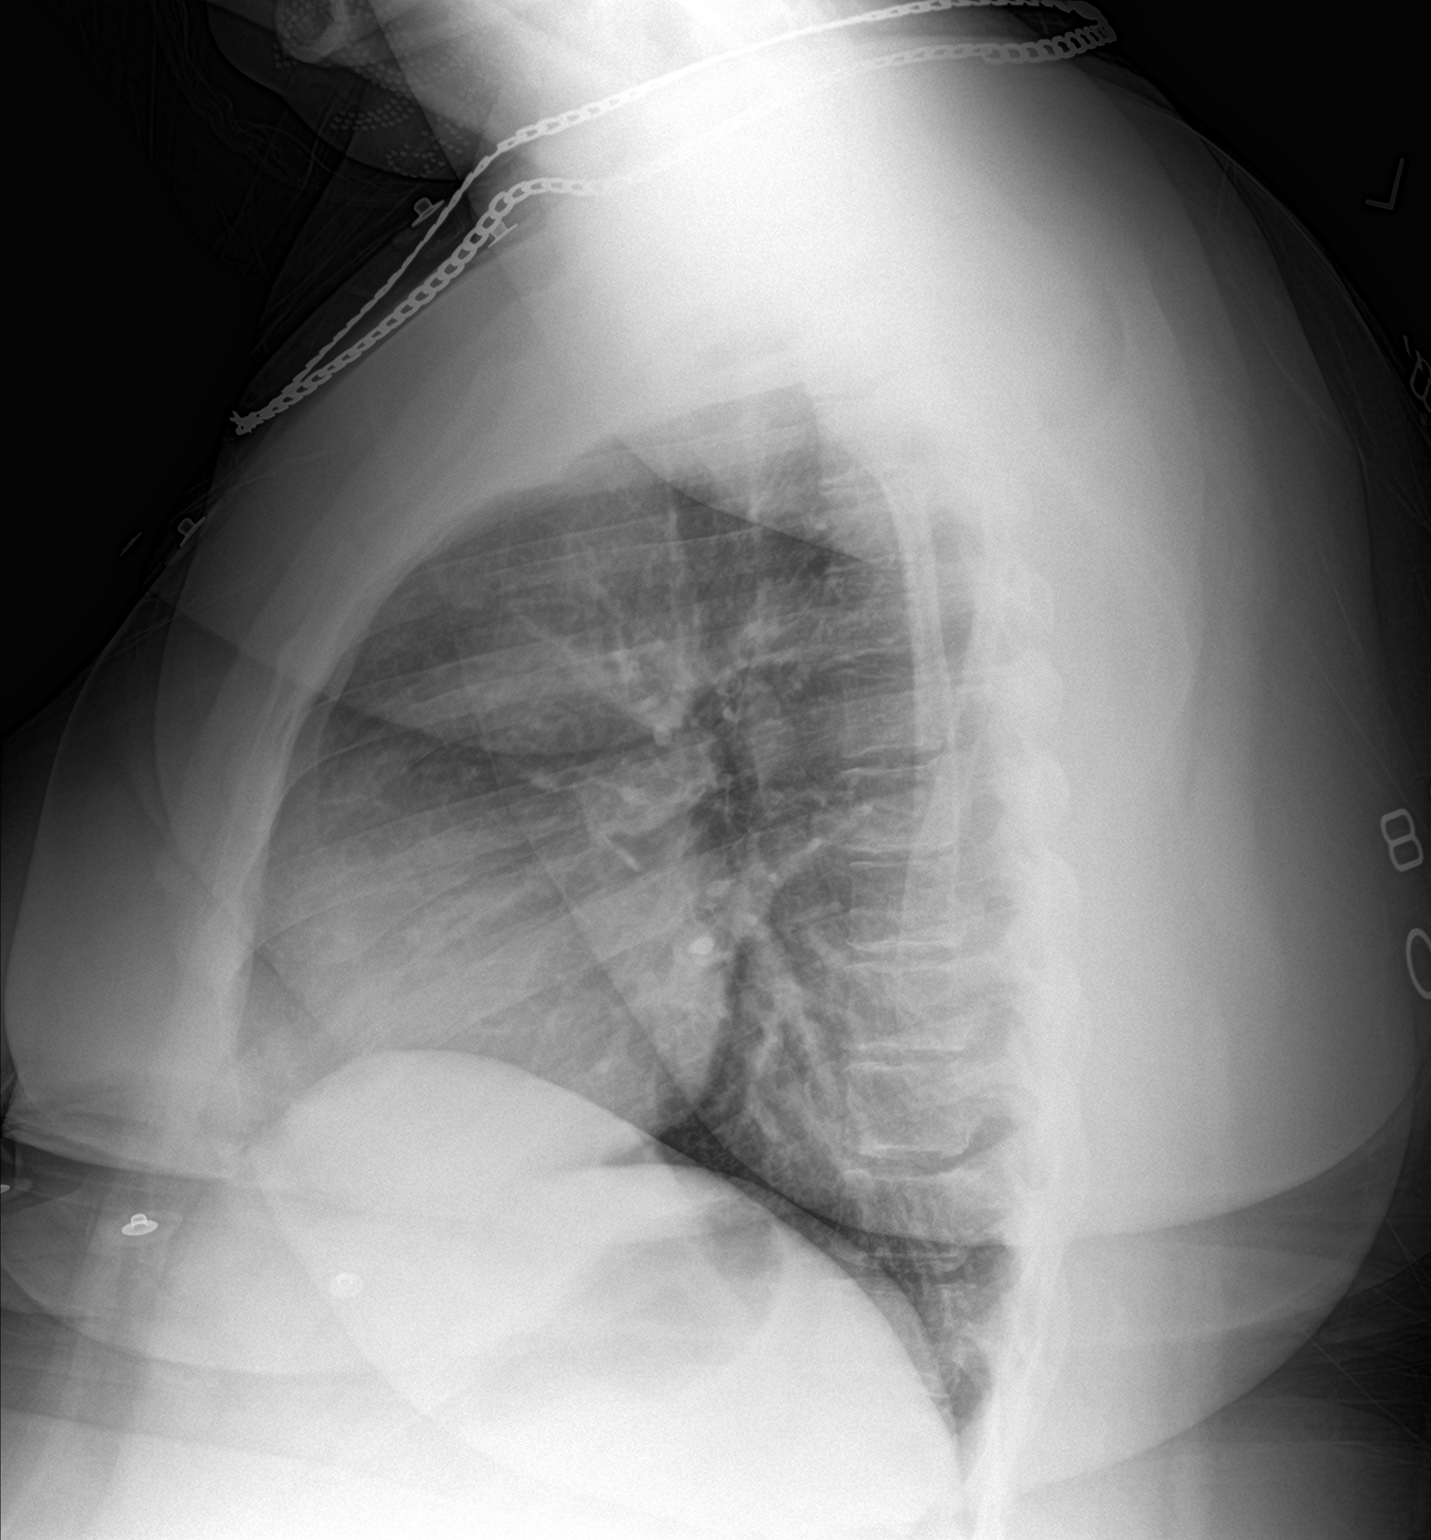

[2 of 2 positions shown; findings below may reference images not displayed]

FINDINGS: The heart size and mediastinal contours are within normal limits.
Both lungs are clear. The visualized skeletal structures are
unremarkable.
IMPRESSION: No active cardiopulmonary disease.
